# Patient Record
Sex: Female | Born: 1940 | Race: White | Hispanic: No | Marital: Married | Smoking: Never smoker
Health system: Southern US, Community
[De-identification: ages and names within clinical notes are randomized; demographics above are authoritative.]

## PROBLEM LIST (undated history)

## (undated) DIAGNOSIS — E7800 Pure hypercholesterolemia, unspecified: Secondary | ICD-10-CM

## (undated) DIAGNOSIS — F329 Major depressive disorder, single episode, unspecified: Secondary | ICD-10-CM

## (undated) DIAGNOSIS — I498 Other specified cardiac arrhythmias: Secondary | ICD-10-CM

## (undated) DIAGNOSIS — K219 Gastro-esophageal reflux disease without esophagitis: Secondary | ICD-10-CM

## (undated) DIAGNOSIS — I809 Phlebitis and thrombophlebitis of unspecified site: Secondary | ICD-10-CM

## (undated) DIAGNOSIS — I776 Arteritis, unspecified: Secondary | ICD-10-CM

## (undated) DIAGNOSIS — S0300XA Dislocation of jaw, unspecified side, initial encounter: Secondary | ICD-10-CM

## (undated) DIAGNOSIS — I10 Essential (primary) hypertension: Secondary | ICD-10-CM

## (undated) DIAGNOSIS — F32A Depression, unspecified: Secondary | ICD-10-CM

## (undated) DIAGNOSIS — E785 Hyperlipidemia, unspecified: Secondary | ICD-10-CM

## (undated) DIAGNOSIS — R42 Dizziness and giddiness: Secondary | ICD-10-CM

## (undated) DIAGNOSIS — M1990 Unspecified osteoarthritis, unspecified site: Secondary | ICD-10-CM

## (undated) DIAGNOSIS — I4891 Unspecified atrial fibrillation: Secondary | ICD-10-CM

## (undated) HISTORY — PX: OTHER SURGICAL HISTORY: SHX169

## (undated) HISTORY — PX: APPENDECTOMY: SHX54

## (undated) HISTORY — PX: CHOLECYSTECTOMY: SHX55

## (undated) HISTORY — PX: TONSILLECTOMY: SUR1361

## (undated) HISTORY — PX: SMALL BOWEL REPAIR: SHX6447

## (undated) HISTORY — PX: ABDOMINAL HYSTERECTOMY: SHX81

## (undated) HISTORY — PX: FOOT SURGERY: SHX648

---

## 2017-10-04 ENCOUNTER — Ambulatory Visit (INDEPENDENT_AMBULATORY_CARE_PROVIDER_SITE_OTHER): Payer: Medicare HMO

## 2017-10-04 ENCOUNTER — Ambulatory Visit
Admission: EM | Admit: 2017-10-04 | Discharge: 2017-10-04 | Disposition: A | Discharge status: Other Acute Inpatient Hospital | Payer: Medicare HMO | Attending: Family Medicine | Admitting: Family Medicine

## 2017-10-04 ENCOUNTER — Encounter: Payer: Self-pay | Admitting: Gynecology

## 2017-10-04 ENCOUNTER — Other Ambulatory Visit: Payer: Self-pay

## 2017-10-04 DIAGNOSIS — W19XXXA Unspecified fall, initial encounter: Secondary | ICD-10-CM

## 2017-10-04 DIAGNOSIS — R6884 Jaw pain: Secondary | ICD-10-CM | POA: Diagnosis not present

## 2017-10-04 DIAGNOSIS — R0789 Other chest pain: Secondary | ICD-10-CM

## 2017-10-04 DIAGNOSIS — S0080XA Unspecified superficial injury of other part of head, initial encounter: Secondary | ICD-10-CM

## 2017-10-04 DIAGNOSIS — S02612A Fracture of condylar process of left mandible, initial encounter for closed fracture: Secondary | ICD-10-CM

## 2017-10-04 HISTORY — DX: Dislocation of jaw, unspecified side, initial encounter: S03.00XA

## 2017-10-04 HISTORY — DX: Gastro-esophageal reflux disease without esophagitis: K21.9

## 2017-10-04 HISTORY — DX: Major depressive disorder, single episode, unspecified: F32.9

## 2017-10-04 HISTORY — DX: Dizziness and giddiness: R42

## 2017-10-04 HISTORY — DX: Essential (primary) hypertension: I10

## 2017-10-04 HISTORY — DX: Phlebitis and thrombophlebitis of unspecified site: I80.9

## 2017-10-04 HISTORY — DX: Unspecified atrial fibrillation: I48.91

## 2017-10-04 HISTORY — DX: Unspecified osteoarthritis, unspecified site: M19.90

## 2017-10-04 HISTORY — DX: Hyperlipidemia, unspecified: E78.5

## 2017-10-04 HISTORY — DX: Arteritis, unspecified: I77.6

## 2017-10-04 HISTORY — DX: Other specified cardiac arrhythmias: I49.8

## 2017-10-04 HISTORY — DX: Depression, unspecified: F32.A

## 2017-10-04 HISTORY — DX: Pure hypercholesterolemia, unspecified: E78.00

## 2017-10-04 NOTE — Discharge Instructions (Signed)
Go straight to the hospital. ? ?Take care ? ?Dr. Shaft Corigliano  ?

## 2017-10-04 NOTE — ED Provider Notes (Signed)
[facility]    CSN: [id number] Arrival date & time: 10/04/17  1600  History   Chief Complaint Chief Complaint  Patient presents with  . Fall   HPI  77 year old female presents following a fall.  Patient was in a parking lot and tripped and fell.  She hit her chin on concrete.  Patient has a small laceration.  Patient is reporting jaw pain.  Per review of the electron medical record, patient has chronic TMJ.  Patient reports that she is having right-sided chest pain, which is likely from the fall.  No medications or interventions tried.  Her pain is moderate to severe.  Jaw pain worse with opening the mouth.  Review of the EMR indicates that she has difficulty opening her mouth widely due to chronic TMJ.  She was brought in directly for evaluation given age and injury.  No reports of headache.  No loss of consciousness.  No nausea vomiting.  No other associated symptoms.  No other concerns at this time.  PMH: Chest discomfort  atypical - Normal SE 2008.  Hypertension    Hyperlipidemia    GERD (gastroesophageal reflux disease)    Atrial dysrhythmia  Likely atrial tachycardia versus atypical atrial flutter with rapid ventricular response.  Thrombophlebitis  Left forearm  History of echocardiogram  09/06/11 [facility]: Mild LVH with normal left ventricular systolic function. NL rt ventricular size & systolic function. No significant valvular regurgitation. No valvular stenosis. Mitral valve some buckling; no prolapse or MR. No pericardial effusion.   Vasculitis (CMS-HCC)    Hypercholesterolemia 05/31/2014 On lipitor (taken of another)   Peripheral arterial disease (CMS-HCC) 05/31/2014 Is s/p right ulnar bypass. (harvested vein in leg for this 2004)  Arthritis    Atrial fibrillation , unspecified (CMS-HCC)    TMJ pain dysfunction syndrome    Atrial tachycardia (CMS-HCC) 04/05/2015   Dizziness    Family history of coronary artery disease 01/04/2016 father and brother  -- pt taking lipitor, asp , zestril     Surgical Hx: right ulnar bypass   2004 - Peripheral arterial disease.   TONSILLECTOMY   1960   APPENDECTOMY   1965   cholelcystectomy   1970   small bowel obstruction repair   1973   history of hysterectomy   1965   left foot and ankle fracture repair   1997    OB History   None    Home Medications    Prior to Admission medications   Medication Sig Start Date End Date Taking? Authorizing Provider  aspirin EC 81 MG tablet Take by mouth.   Yes [provider]  atorvastatin (LIPITOR) 40 MG tablet Take 40 mg by mouth daily. 08/23/17  Yes [provider]  cyclobenzaprine (FLEXERIL) 5 MG tablet  07/23/17  Yes [provider]  meclizine (ANTIVERT) 25 MG tablet Take by mouth.   Yes [provider]  nitroGLYCERIN (NITRODUR - DOSED IN MG/24 HR) 0.2 mg/hr patch Apply to affected area every 24 hours, repeat as needed, do not use with medications such as cialis or viagra 01/02/16  Yes [provider]  pantoprazole (PROTONIX) 40 MG tablet Take 40 mg by mouth daily. 08/08/17  Yes [provider]  spironolactone (ALDACTONE) 25 MG tablet Take 25 mg by mouth daily. 08/23/17  Yes [provider]  valACYclovir (VALTREX) 500 MG tablet Take by mouth. 04/25/17  Yes [provider]  ALPRAZolam Prudy Feeler) 0.5 MG tablet Take 0.5 mg by mouth 2 (two) times daily. 09/04/17  [provider]    Family History Myocardial Infarction (Heart attack) Brother    Myocardial Infarction (Heart attack) Father    No Known Problems Maternal Aunt    No Known Problems Maternal Grandfather    No Known Problems Maternal Grandmother    No Known Problems Maternal Uncle    No Known Problems Mother    No Known Problems Paternal Aunt    No Known Problems Paternal Grandfather    No Known Problems Paternal Grandmother    No Known Problems Paternal Uncle    No Known Problems  Sister    Cancer     Kidney disease     Allergies Neg Hx    Anesthesia problems Neg Hx    Asthma Neg Hx    Clotting disorder Neg Hx    Diabetes Neg Hx    Hearing loss Neg Hx    Heart disease Neg Hx    Neurological disorder Neg Hx    Thyroid disease Neg Hx     Social History Social History   Tobacco Use  . Smoking status: Never Smoker  . Smokeless tobacco: Never Used  Substance Use Topics  . Alcohol use: Never    Frequency: Never  . Drug use: Never   Allergies   Alendronate; Celecoxib; Codeine; Risedronate; Simvastatin; Trazodone; and Sulfa antibiotics   Review of Systems Review of Systems  HENT:       Jaw pain.  Cardiovascular: Positive for chest pain.  Skin: Positive for wound.   Physical Exam Triage Vital Signs ED Triage Vitals  Enc Vitals Group     BP 10/04/17 1616 (!) 167/80     Pulse Rate 10/04/17 1616 85     Resp 10/04/17 1616 16     Temp 10/04/17 1616 98.4 F (36.9 C)     Temp Source 10/04/17 1616 Oral     SpO2 10/04/17 1616 100 %     Weight 10/04/17 1613 123 lb (55.8 kg)     Height 10/04/17 1613 5\' 3"  (1.6 m)     Head Circumference --      Peak Flow --      Pain Score 10/04/17 1613 10     Pain Loc --      Pain Edu? --      Excl. in GC? --    Updated Vital Signs BP (!) 167/80 (BP Location: Left Arm)   Pulse 85   Temp 98.4 F (36.9 C) (Oral)   Resp 16   Ht 5\' 3"  (1.6 m)   Wt 123 lb (55.8 kg)   SpO2 100%   BMI 21.79 kg/m   Visual Acuity Right Eye Distance:   Left Eye Distance:   Bilateral Distance:    Right Eye Near:   Left Eye Near:    Bilateral Near:     Physical Exam  Constitutional: She is oriented to person, place, and time.  Thin elderly female.  Appears uncomfortable.  No apparent distress.  HENT:  Patient with tenderness of the left jaw particular around the TMJ and just distal.  Pain with opening the mouth.  Cardiovascular: Normal rate and regular rhythm.  Pulmonary/Chest: Effort normal and breath  sounds normal. She has no wheezes. She has no rales.  Neurological: She is alert and oriented to person, place, and time.  Skin:  Small chin laceration noted.  Psychiatric: She has a normal mood and affect. Her behavior is normal.  Nursing note and vitals reviewed.  UC Treatments / Results  Labs (  all labs ordered are listed, but only abnormal results are displayed) Labs Reviewed - No data to display  EKG None  Radiology Dg Mandible 4 Views  Result Date: 10/04/2017 CLINICAL DATA:  Chin laceration and pain in the left TMJ region following a fall today. EXAM: MANDIBLE - 4+ VIEW COMPARISON:  None. FINDINGS: On the frontal view, there is evidence of a displaced left mandibular condylar neck fracture. This is not confirmed on the other views, due to bony overlap. IMPRESSION: Suboptimal visualization of the mandible with a probable displaced left mandibular condylar neck fracture. Further evaluation with a maxillofacial CT without contrast is recommended. Electronically Signed   By: Beckie Salts M.D.   On: 10/04/2017 16:43    Procedures Procedures (including critical care time)  Medications Ordered in UC Medications - No data to display  Initial Impression / Assessment and Plan / UC Course  I have reviewed the triage vital signs and the nursing notes.  Pertinent labs & imaging results that were available during my care of the patient were reviewed by me and considered in my medical decision making (see chart for details).    77 year old female presents for evaluation after suffering a fall.  Patient has an open wound and x-ray revealed a mandible fracture.  Daughter is taking her directly to the hospital for further evaluation and treatment.  Final Clinical Impressions(s) / UC Diagnoses   Final diagnoses:  Fall  Closed fracture of left condylar process of mandible, initial encounter [facility])     Discharge Instructions     Go straight to the hospital.  Take care  Dr. Adriana Simas      ED Prescriptions    None     Controlled Substance Prescriptions [place] Controlled Substance Registry consulted? Not Applicable   Tommie Sams, DO 10/04/17 1656

## 2017-10-04 NOTE — ED Triage Notes (Signed)
Per patient fell at parking lot today. Patient stated felt pop at left side face. Patient present today with laceration to her chin. Patient also stated experiencing pain in her chest.

## 2017-10-05 MED ORDER — ONDANSETRON HCL 4 MG/2ML IJ SOLN
4.00 | INTRAMUSCULAR | Status: DC
Start: ? — End: 2017-10-05

## 2017-10-05 MED ORDER — GENERIC EXTERNAL MEDICATION
1.00 | Status: DC
Start: 2017-10-05 — End: 2017-10-05

## 2017-10-05 MED ORDER — ATORVASTATIN CALCIUM 40 MG PO TABS
40.00 | ORAL_TABLET | ORAL | Status: DC
Start: 2017-10-06 — End: 2017-10-05

## 2017-10-05 MED ORDER — MORPHINE SULFATE 2 MG/ML IJ SOLN
2.00 | INTRAMUSCULAR | Status: DC
Start: ? — End: 2017-10-05

## 2017-10-05 MED ORDER — CYCLOBENZAPRINE HCL 10 MG PO TABS
5.00 | ORAL_TABLET | ORAL | Status: DC
Start: 2017-10-06 — End: 2017-10-05

## 2017-10-05 MED ORDER — LIDOCAINE HCL 1 % IJ SOLN
0.50 | INTRAMUSCULAR | Status: DC
Start: ? — End: 2017-10-05

## 2017-10-05 MED ORDER — LIDOCAINE 5 % EX PTCH
1.00 | MEDICATED_PATCH | CUTANEOUS | Status: DC
Start: 2017-10-06 — End: 2017-10-05

## 2017-10-05 MED ORDER — ALPRAZOLAM 0.5 MG PO TABS
0.50 | ORAL_TABLET | ORAL | Status: DC
Start: 2017-10-05 — End: 2017-10-05

## 2021-04-26 ENCOUNTER — Emergency Department
Admission: EM | Admit: 2021-04-26 | Discharge: 2021-04-26 | Disposition: A | Discharge status: Home / Self Care | Payer: Medicare HMO | Attending: Emergency Medicine | Admitting: Emergency Medicine

## 2021-04-26 ENCOUNTER — Other Ambulatory Visit: Payer: Self-pay

## 2021-04-26 ENCOUNTER — Encounter: Payer: Self-pay | Admitting: Emergency Medicine

## 2021-04-26 DIAGNOSIS — M542 Cervicalgia: Secondary | ICD-10-CM | POA: Diagnosis present

## 2021-04-26 DIAGNOSIS — M436 Torticollis: Secondary | ICD-10-CM | POA: Insufficient documentation

## 2021-04-26 DIAGNOSIS — I10 Essential (primary) hypertension: Secondary | ICD-10-CM | POA: Insufficient documentation

## 2021-04-26 MED ORDER — DIAZEPAM 2 MG PO TABS
ORAL_TABLET | ORAL | Status: AC
  Filled 2021-04-26: qty 1

## 2021-04-26 MED ORDER — ONDANSETRON 4 MG PO TBDP
ORAL_TABLET | ORAL | Status: AC
  Filled 2021-04-26: qty 1

## 2021-04-26 MED ORDER — TRAMADOL HCL 50 MG PO TABS
ORAL_TABLET | ORAL | Status: AC
  Filled 2021-04-26: qty 1

## 2021-04-26 MED ORDER — KETOROLAC TROMETHAMINE 30 MG/ML IJ SOLN
INTRAMUSCULAR | Status: AC
  Filled 2021-04-26: qty 1

## 2021-04-26 NOTE — ED Provider Notes (Signed)
Patient was seen during Epic computer downtime; please refer to paper chart for details.   Irean Hong, MD 04/26/21 [phone]

## 2021-04-26 NOTE — ED Triage Notes (Signed)
Patient ambulatory to triage with steady gait, without difficulty or distress noted; pt reports neck pain with ROM since awakening yesterday am; seen at [facility] and had inj for "spasms" but pain returned; pt denies any injury

## 2021-04-26 NOTE — ED Triage Notes (Addendum)
Pt arrives via POV with CC of neck pain that began on 04/25/21 upon awakening. Pt reports sleeping on L side without a pillow that night. Pt states pain began on R side of neck and is now on the left and back of neck and in the shoulders. Pt rates pain 10/10. Denies dizziness, lightheadedness, and changes in vision.

## 2021-08-12 ENCOUNTER — Emergency Department: Payer: Medicare HMO

## 2021-08-12 ENCOUNTER — Observation Stay
Admission: EM | Admit: 2021-08-12 | Discharge: 2021-08-15 | Disposition: A | Discharge status: Skilled Nursing Facility | Payer: Medicare HMO | Attending: Internal Medicine | Admitting: Internal Medicine

## 2021-08-12 ENCOUNTER — Other Ambulatory Visit: Payer: Self-pay

## 2021-08-12 DIAGNOSIS — M47812 Spondylosis without myelopathy or radiculopathy, cervical region: Secondary | ICD-10-CM | POA: Insufficient documentation

## 2021-08-12 DIAGNOSIS — Z7982 Long term (current) use of aspirin: Secondary | ICD-10-CM | POA: Diagnosis not present

## 2021-08-12 DIAGNOSIS — M25551 Pain in right hip: Secondary | ICD-10-CM | POA: Diagnosis present

## 2021-08-12 DIAGNOSIS — W010XXA Fall on same level from slipping, tripping and stumbling without subsequent striking against object, initial encounter: Secondary | ICD-10-CM | POA: Insufficient documentation

## 2021-08-12 DIAGNOSIS — E871 Hypo-osmolality and hyponatremia: Secondary | ICD-10-CM

## 2021-08-12 DIAGNOSIS — S32591A Other specified fracture of right pubis, initial encounter for closed fracture: Principal | ICD-10-CM

## 2021-08-12 DIAGNOSIS — Z8679 Personal history of other diseases of the circulatory system: Secondary | ICD-10-CM

## 2021-08-12 DIAGNOSIS — S329XXA Fracture of unspecified parts of lumbosacral spine and pelvis, initial encounter for closed fracture: Secondary | ICD-10-CM | POA: Diagnosis not present

## 2021-08-12 DIAGNOSIS — Y9222 Religious institution as the place of occurrence of the external cause: Secondary | ICD-10-CM | POA: Insufficient documentation

## 2021-08-12 DIAGNOSIS — S32501A Unspecified fracture of right pubis, initial encounter for closed fracture: Secondary | ICD-10-CM | POA: Diagnosis not present

## 2021-08-12 DIAGNOSIS — I4891 Unspecified atrial fibrillation: Secondary | ICD-10-CM | POA: Diagnosis not present

## 2021-08-12 DIAGNOSIS — Z79899 Other long term (current) drug therapy: Secondary | ICD-10-CM | POA: Insufficient documentation

## 2021-08-12 DIAGNOSIS — I6782 Cerebral ischemia: Secondary | ICD-10-CM | POA: Insufficient documentation

## 2021-08-12 DIAGNOSIS — I10 Essential (primary) hypertension: Secondary | ICD-10-CM | POA: Diagnosis present

## 2021-08-12 DIAGNOSIS — E785 Hyperlipidemia, unspecified: Secondary | ICD-10-CM | POA: Diagnosis present

## 2021-08-12 DIAGNOSIS — K219 Gastro-esophageal reflux disease without esophagitis: Secondary | ICD-10-CM

## 2021-08-12 LAB — COMPREHENSIVE METABOLIC PANEL
ALT: 17 U/L (ref 0–44)
AST: 25 U/L (ref 15–41)
Albumin: 3.6 g/dL (ref 3.5–5.0)
Alkaline Phosphatase: 48 U/L (ref 38–126)
Anion gap: 9 (ref 5–15)
BUN: 17 mg/dL (ref 8–23)
CO2: 27 mmol/L (ref 22–32)
Calcium: 9 mg/dL (ref 8.9–10.3)
Chloride: 105 mmol/L (ref 98–111)
Creatinine, Ser: 0.74 mg/dL (ref 0.44–1.00)
GFR, Estimated: >60 mL/min (ref >60)
Glucose, Bld: 86 mg/dL (ref 70–99)
Potassium: 3.6 mmol/L (ref 3.5–5.1)
Sodium: 141 mmol/L (ref 135–145)
Total Bilirubin: 1 mg/dL (ref 0.3–1.2)
Total Protein: 6.1 g/dL — ABNORMAL LOW (ref 6.5–8.1)

## 2021-08-12 LAB — CBC WITH DIFFERENTIAL/PLATELET
Abs Immature Granulocytes: 0.06 10*3/uL (ref 0.00–0.07)
Basophils Absolute: 0 10*3/uL (ref 0.0–0.1)
Basophils Relative: 0 %
Eosinophils Absolute: 0.1 10*3/uL (ref 0.0–0.5)
Eosinophils Relative: 1 %
HCT: 39.4 % (ref 36.0–46.0)
Hemoglobin: 13.1 g/dL (ref 12.0–15.0)
Immature Granulocytes: 1 %
Lymphocytes Relative: 11 %
Lymphs Abs: 1.1 10*3/uL (ref 0.7–4.0)
MCH: 32 pg (ref 26.0–34.0)
MCHC: 33.2 g/dL (ref 30.0–36.0)
MCV: 96.1 fL (ref 80.0–100.0)
Monocytes Absolute: 0.7 10*3/uL (ref 0.1–1.0)
Monocytes Relative: 7 %
Neutro Abs: 7.8 10*3/uL — ABNORMAL HIGH (ref 1.7–7.7)
Neutrophils Relative %: 80 %
Platelets: 190 10*3/uL (ref 150–400)
RBC: 4.1 MIL/uL (ref 3.87–5.11)
RDW: 11.7 % (ref 11.5–15.5)
WBC: 9.8 10*3/uL (ref 4.0–10.5)
nRBC: 0 % (ref 0.0–0.2)

## 2021-08-12 MED ORDER — PANTOPRAZOLE SODIUM 40 MG PO TBEC
40.0000 mg | DELAYED_RELEASE_TABLET | Freq: Every day | ORAL | Status: DC
Start: 2021-08-12 — End: 2021-08-15
  Administered 2021-08-13 – 2021-08-15 (×3): 40 mg via ORAL
  Filled 2021-08-12 (×4): qty 1

## 2021-08-12 MED ORDER — PROMETHAZINE HCL 25 MG PO TABS: 25.0000 mg | ORAL_TABLET | ORAL | Status: DC | PRN

## 2021-08-12 MED ORDER — ALPRAZOLAM 0.5 MG PO TABS: 0.5000 mg | ORAL_TABLET | Freq: Two times a day (BID) | ORAL | Status: DC

## 2021-08-12 MED ORDER — SENNOSIDES-DOCUSATE SODIUM 8.6-50 MG PO TABS
1.0000 | ORAL_TABLET | Freq: Every evening | ORAL | Status: DC | PRN
  Administered 2021-08-13 – 2021-08-15 (×2): 1 via ORAL
  Filled 2021-08-12 (×2): qty 1

## 2021-08-12 MED ORDER — HYDROMORPHONE HCL 1 MG/ML IJ SOLN
0.5000 mg | INTRAMUSCULAR | Status: DC | PRN
  Administered 2021-08-12: 0.5 mg via INTRAVENOUS
  Filled 2021-08-12: qty 1

## 2021-08-12 MED ORDER — ASPIRIN 81 MG PO TBEC
81.0000 mg | DELAYED_RELEASE_TABLET | Freq: Every day | ORAL | Status: DC
  Administered 2021-08-12 – 2021-08-15 (×4): 81 mg via ORAL
  Filled 2021-08-12 (×4): qty 1

## 2021-08-12 MED ORDER — IPRATROPIUM BROMIDE 0.03 % NA SOLN: 2.0000 | Freq: Two times a day (BID) | NASAL | Status: DC

## 2021-08-12 MED ORDER — ATORVASTATIN CALCIUM 20 MG PO TABS
40.0000 mg | ORAL_TABLET | Freq: Every day | ORAL | Status: DC
Start: 2021-08-12 — End: 2021-08-15
  Administered 2021-08-12 – 2021-08-15 (×4): 40 mg via ORAL
  Filled 2021-08-12 (×4): qty 2

## 2021-08-12 MED ORDER — HYDROCODONE-ACETAMINOPHEN 5-325 MG PO TABS
1.0000 | ORAL_TABLET | Freq: Four times a day (QID) | ORAL | Status: DC | PRN
  Administered 2021-08-12 – 2021-08-15 (×8): 1 via ORAL
  Administered 2021-08-15: 2 via ORAL
  Filled 2021-08-12 (×2): qty 1
  Filled 2021-08-12: qty 2
  Filled 2021-08-12 (×6): qty 1

## 2021-08-12 MED ORDER — HYDROMORPHONE HCL 1 MG/ML IJ SOLN
0.5000 mg | Freq: Once | INTRAMUSCULAR | Status: AC
  Administered 2021-08-12: 0.5 mg via INTRAVENOUS
  Filled 2021-08-12: qty 0.5

## 2021-08-12 MED ORDER — ALPRAZOLAM 0.5 MG PO TABS
0.5000 mg | ORAL_TABLET | Freq: Two times a day (BID) | ORAL | Status: DC | PRN
  Administered 2021-08-12 – 2021-08-14 (×3): 0.5 mg via ORAL
  Filled 2021-08-12 (×3): qty 1

## 2021-08-12 MED ORDER — ENOXAPARIN SODIUM 40 MG/0.4ML IJ SOSY
40.0000 mg | PREFILLED_SYRINGE | INTRAMUSCULAR | Status: DC
  Administered 2021-08-12 – 2021-08-14 (×3): 40 mg via SUBCUTANEOUS
  Filled 2021-08-12 (×3): qty 0.4

## 2021-08-12 MED ORDER — DOCUSATE SODIUM 100 MG PO CAPS
100.0000 mg | ORAL_CAPSULE | Freq: Two times a day (BID) | ORAL | Status: DC
  Administered 2021-08-12 – 2021-08-15 (×7): 100 mg via ORAL
  Filled 2021-08-12 (×7): qty 1

## 2021-08-12 MED ORDER — MELOXICAM 7.5 MG PO TABS
15.0000 mg | ORAL_TABLET | Freq: Every day | ORAL | Status: DC
Start: 2021-08-12 — End: 2021-08-15
  Administered 2021-08-13 – 2021-08-15 (×3): 15 mg via ORAL
  Filled 2021-08-12 (×3): qty 2

## 2021-08-12 MED ORDER — ONDANSETRON HCL 4 MG/2ML IJ SOLN
4.0000 mg | Freq: Once | INTRAMUSCULAR | Status: AC
  Administered 2021-08-12: 4 mg via INTRAVENOUS
  Filled 2021-08-12: qty 2

## 2021-08-12 MED ORDER — SPIRONOLACTONE 25 MG PO TABS
25.0000 mg | ORAL_TABLET | Freq: Every day | ORAL | Status: DC
Start: 2021-08-12 — End: 2021-08-15
  Administered 2021-08-13 – 2021-08-15 (×3): 25 mg via ORAL
  Filled 2021-08-12 (×3): qty 1

## 2021-08-12 MED ORDER — FENTANYL CITRATE PF 50 MCG/ML IJ SOSY
50.0000 ug | PREFILLED_SYRINGE | INTRAMUSCULAR | Status: DC | PRN
  Administered 2021-08-12: 50 ug via INTRAVENOUS
  Filled 2021-08-12: qty 1

## 2021-08-12 NOTE — Progress Notes (Signed)
Called by ED physician, Dr. Starleen Blue. Imaging reviewed. There appears to be right and left pubic rami fractures. No evidence of proximal femur or acetabular fractures.  WBAT on bilateral lower extremities PT/OT for mobilization after appropriate pain control Follow up with Orthopedics in ~2 weeks for re-evaluation. Please page with any questions.
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
## 2021-08-12 NOTE — ED Notes (Signed)
Lab at bedside to obtain bloodwork

## 2021-08-12 NOTE — H&P (Addendum)
History and Physical    Misty SaleBarbara Thiemann [id number] DOB: 01-25-1941 DOA: 08/12/2021  PCP: Marlan PalauGard, Allison  Patient coming from: church s/p fall  I have personally briefly reviewed patient's [facility]  Chief Complaint: fall with hip pain   HPI: Misty Marshall is a 81 y.o. female with medical history significant of Afib, HTN, HLD,GERD, Depression,PAD Who presents to ed s/p mechanical fall at church after she broke a church member fall, s/p which patient  fell.  After patient fall she noted hip pain and was unable to ambulate. She notes currently no HA, n/v/d/dysuria/chest pain /sob fever or chills.   ED Course:  Afeb, bp 162/89, hr 68, sat 95% on ra    EKG: Sinus rhythm with PAC LAFB  Imaging:  Xray hip IMPRESSION: Essentially nondisplaced acute fractures of the right superior and inferior pubic rami. Cxr: Hyperexpansion with chronic interstitial coarsening compatible with emphysema.   Streaky opacity in the infrahilar right base likely atelectatic or scarring.    CT Pelvis   IMPRESSION: Minimally displaced right inferior pubic ramus and nondisplaced right superior pubic ramus, right pubic body, and left pubic body acute fractures.   Tx Zofran, fentanyl,dilaudid injection 0.5mg  x1  Review of Systems: As per HPI otherwise 10 point review of systems negative.   Past Medical History:  Diagnosis Date   Arthritis    Atrial dysrhythmia    Atrial fibrillation (HCC)    Depression    Dizziness    GERD (gastroesophageal reflux disease)    Hypercholesteremia    Hyperlipemia    Hypertension    Thrombophlebitis    TMJ (dislocation of temporomandibular joint)    Vasculitis (HCC)     Past Surgical History:  Procedure Laterality Date   ABDOMINAL HYSTERECTOMY     APPENDECTOMY     CHOLECYSTECTOMY     FOOT SURGERY     SMALL BOWEL REPAIR     TONSILLECTOMY     ulnar bypass Right      reports that she has never smoked. She has never  used smokeless tobacco. She reports that she does not drink alcohol and does not use drugs.  Allergies  Allergen Reactions   Alendronate Other (See Comments)    Difficulty swallowing, stomach pain   Celecoxib Other (See Comments)    Stomach pain   Codeine Other (See Comments)    Other Reaction: Not Assessed   Risedronate Other (See Comments)    diffculty swallowing, stomach pain   Simvastatin Other (See Comments)   Trazodone Other (See Comments)   Sulfa Antibiotics Hives and Rash    Family History  Problem Relation Age of Onset   Heart failure Mother    Heart failure Brother     Prior to Admission medications   Medication Sig Start Date End Date Taking? Authorizing Provider  ALPRAZolam Prudy Feeler(XANAX) 0.5 MG tablet Take 0.5 mg by mouth 2 (two) times daily. 09/04/17   [provider]  aspirin EC 81 MG tablet Take by mouth.    [provider]  atorvastatin (LIPITOR) 40 MG tablet Take 40 mg by mouth daily. 08/23/17   [provider]  cyclobenzaprine (FLEXERIL) 5 MG tablet  07/23/17   [provider]  ipratropium (ATROVENT) 0.03 % nasal spray Place 2 sprays into both nostrils 2 (two) times daily. 03/07/21   [provider]  meclizine (ANTIVERT) 25 MG tablet Take by mouth.    [provider]  nitroGLYCERIN (NITRODUR - DOSED IN MG/24 HR) 0.2 mg/hr  patch Apply to affected area every 24 hours, repeat as needed, do not use with medications such as cialis or viagra 01/02/16   [provider]  pantoprazole (PROTONIX) 40 MG tablet Take 40 mg by mouth daily. 08/08/17   [provider]  spironolactone (ALDACTONE) 25 MG tablet Take 25 mg by mouth daily. 08/23/17   [provider]  valACYclovir (VALTREX) 500 MG tablet Take by mouth. 04/25/17   [provider]    Physical Exam: Vitals:   08/12/21 1158 08/12/21 1159 08/12/21 1200  BP: (!) 162/89  (!) 162/89  Pulse: 68  74  Resp: 18    Temp: 97.9 F (36.6 C)    SpO2:  99%  95%  Weight:  58.1 kg   Height:  5\' 3"  (1.6 m)      Vitals:   08/12/21 1158 08/12/21 1159 08/12/21 1200  BP: (!) 162/89  (!) 162/89  Pulse: 68  74  Resp: 18    Temp: 97.9 F (36.6 C)    SpO2: 99%  95%  Weight:  58.1 kg   Height:  5\' 3"  (1.6 m)   Constitutional: NAD, calm, comfortable Eyes: PERRL, lids and conjunctivae normal ENMT: Mucous membranes are moist. Posterior pharynx clear of any exudate or lesions.Normal dentition.  Neck: normal, supple, no masses, no thyromegaly Respiratory: clear to auscultation bilaterally, no wheezing, no crackles. Normal respiratory effort. No accessory muscle use.  Cardiovascular: Regular rate and rhythm, no murmurs / rubs / gallops. No extremity edema. 2+ pedal pulses.   Abdomen: no tenderness, no masses palpated. No hepatosplenomegaly. Bowel sounds positive.  Musculoskeletal: no clubbing / cyanosis. No joint deformity upper and lower extremities. Pain on rom, tenderness pelvic rim ,no contractures. Normal muscle tone.  Skin: no rashes, lesions, ulcers. No induration Neurologic: CN 2-12 grossly intact. Sensation intact, DTR normal. Strength in lower ext not tested due to fx /pain Psychiatric: Normal judgment and insight. Alert and oriented x 3. Normal mood.    Labs on Admission: I have personally reviewed following labs and imaging studies  CBC: Recent Labs  Lab 08/12/21 1401  WBC 9.8  NEUTROABS 7.8*  HGB 13.1  HCT 39.4  MCV 96.1  PLT 190   Basic Metabolic Panel: Recent Labs  Lab 08/12/21 1401  NA 141  K 3.6  CL 105  CO2 27  GLUCOSE 86  BUN 17  CREATININE 0.74  CALCIUM 9.0   GFR: Estimated Creatinine Clearance: 44.8 mL/min (by C-G formula based on SCr of 0.74 mg/dL). Liver Function Tests: Recent Labs  Lab 08/12/21 1401  AST 25  ALT 17  ALKPHOS 48  BILITOT 1.0  PROT 6.1*  ALBUMIN 3.6   No results for input(s): LIPASE, AMYLASE in the last 168 hours. No results for input(s): AMMONIA in the last 168  hours. Coagulation Profile: No results for input(s): INR, PROTIME in the last 168 hours. Cardiac Enzymes: No results for input(s): CKTOTAL, CKMB, CKMBINDEX, TROPONINI in the last 168 hours. BNP (last 3 results) No results for input(s): PROBNP in the last 8760 hours. HbA1C: No results for input(s): HGBA1C in the last 72 hours. CBG: No results for input(s): GLUCAP in the last 168 hours. Lipid Profile: No results for input(s): CHOL, HDL, LDLCALC, TRIG, CHOLHDL, LDLDIRECT in the last 72 hours. Thyroid Function Tests: No results for input(s): TSH, T4TOTAL, FREET4, T3FREE, THYROIDAB in the last 72 hours. Anemia Panel: No results for input(s): VITAMINB12, FOLATE, FERRITIN, TIBC, IRON, RETICCTPCT in the last 72 hours. Urine analysis: No results  found for: COLORURINE, APPEARANCEUR, LABSPEC, PHURINE, GLUCOSEU, HGBUR, BILIRUBINUR, KETONESUR, PROTEINUR, UROBILINOGEN, NITRITE, LEUKOCYTESUR  Radiological Exams on Admission: CT HEAD WO CONTRAST ( )  Result Date: 08/12/2021 CLINICAL DATA:  Neck trauma.  Status post fall. EXAM: CT HEAD WITHOUT CONTRAST CT CERVICAL SPINE WITHOUT CONTRAST TECHNIQUE: Multidetector CT imaging of the head and cervical spine was performed following the standard protocol without intravenous contrast. Multiplanar CT image reconstructions of the cervical spine were also generated. RADIATION DOSE REDUCTION: This exam was performed according to the departmental dose-optimization program which includes automated exposure control, adjustment of the mA and/or kV according to patient size and/or use of iterative reconstruction technique. COMPARISON:  None Available. FINDINGS: CT HEAD FINDINGS Brain: No evidence of acute infarction, hemorrhage, hydrocephalus, extra-axial collection or mass lesion/mass effect. There is mild diffuse low-attenuation within the subcortical and periventricular white matter compatible with chronic microvascular disease. Vascular: No hyperdense vessel or  unexpected calcification. Skull: No acute abnormality. Remote healed deformity involving the left mandibular condyle. Sinuses/Orbits: Paranasal sinuses and mastoid air cells are clear. Other: None CT CERVICAL SPINE FINDINGS Alignment: No signs of acute posttraumatic mild alignment of the cervical spine. Chronic anterolisthesis C4 on C5 is identified measuring 2 mm, image 42/5. Skull base and vertebrae: No acute fracture. No primary bone lesion or focal pathologic process. Soft tissues and spinal canal: No prevertebral fluid or swelling. No visible canal hematoma. Disc levels: Mild multilevel disc space narrowing is identified involving C3-4, C4-5 and C5-6 Upper chest: Negative. Other: None IMPRESSION: 1. No acute intracranial abnormality. 2. 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 3. No evidence for acute cervical spine fracture or subluxation. 4. Cervical spondylosis. Electronically Signed   By: Signa Kell M.D.   On: 08/12/2021 13:33   CT Cervical Spine Wo Contrast  Result Date: 08/12/2021 CLINICAL DATA:  Neck trauma.  Status post fall. EXAM: CT HEAD WITHOUT CONTRAST CT CERVICAL SPINE WITHOUT CONTRAST TECHNIQUE: Multidetector CT imaging of the head and cervical spine was performed following the standard protocol without intravenous contrast. Multiplanar CT image reconstructions of the cervical spine were also generated. RADIATION DOSE REDUCTION: This exam was performed according to the departmental dose-optimization program which includes automated exposure control, adjustment of the mA and/or kV according to patient size and/or use of iterative reconstruction technique. COMPARISON:  None Available. FINDINGS: CT HEAD FINDINGS Brain: No evidence of acute infarction, hemorrhage, hydrocephalus, extra-axial collection or mass lesion/mass effect. There is mild diffuse low-attenuation within the subcortical and periventricular white matter compatible with chronic microvascular disease. Vascular: No  hyperdense vessel or unexpected calcification. Skull: No acute abnormality. Remote healed deformity involving the left mandibular condyle. Sinuses/Orbits: Paranasal sinuses and mastoid air cells are clear. Other: None CT CERVICAL SPINE FINDINGS Alignment: No signs of acute posttraumatic mild alignment of the cervical spine. Chronic anterolisthesis C4 on C5 is identified measuring 2 mm, image 42/5. Skull base and vertebrae: No acute fracture. No primary bone lesion or focal pathologic process. Soft tissues and spinal canal: No prevertebral fluid or swelling. No visible canal hematoma. Disc levels: Mild multilevel disc space narrowing is identified involving C3-4, C4-5 and C5-6 Upper chest: Negative. Other: None IMPRESSION: 1. No acute intracranial abnormality. 2. 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 3. No evidence for acute cervical spine fracture or subluxation. 4. Cervical spondylosis. Electronically Signed   By: Signa Kell M.D.   On: 08/12/2021 13:33   CT PELVIS WO CONTRAST  Result Date: 08/12/2021 CLINICAL DATA:  Hip trauma.  Fracture suspected. EXAM: CT PELVIS  WITHOUT CONTRAST TECHNIQUE: Multidetector CT imaging of the pelvis was performed following the standard protocol without intravenous contrast. RADIATION DOSE REDUCTION: This exam was performed according to the departmental dose-optimization program which includes automated exposure control, adjustment of the mA and/or kV according to patient size and/or use of iterative reconstruction technique. COMPARISON:  Pelvis and right hip radiographs 08/12/2021; CT abdomen and pelvis 09/07/2019 FINDINGS: Urinary Tract:  No focal urinary bladder wall thickening. Bowel: Moderate to high-grade sigmoid diverticulosis. No inflammatory changes to indicate acute diverticulitis. Vascular/Lymphatic: Minimally partially visualized atherosclerotic calcifications at the aortic-iliac bifurcation. Reproductive: The uterus appears to be surgically absent. No  gross adnexal abnormality is seen. Other: Moderate left-greater-than-right chronic enthesopathic changes at the bilateral common hamstring tendon origins off the ischial tuberosities. Musculoskeletal: There is a nondisplaced linear vertical oriented acute fracture within the superolateral aspect of the right pubic ramus (coronal series 9, image 40 and axial series 4 images 30 and 31). There is an oblique, acute fracture of the right inferior pubic ramus with mild 3 mm lateral displacement of the anterior fracture component respect of the posterior fracture component (axial series 4, image 41 and coronal series 9, image 58). There is an acute, nondisplaced vertically oriented fracture within the left pubic body (axial series 4, image 34, sagittal series 10, image 126, coronal series 9, image 31). Minimal 1 mm cortical step-off of the superior aspect of the right pubic body, likely an additional tiny fracture (coronal series 9, image 34). Small sclerotic bone island within the superomedial left acetabulum, similar to 09/07/2019 CT. Moderate to severe bilateral femoroacetabular joint space narrowing. The bilateral sacroiliac joint spaces are maintained. IMPRESSION: Minimally displaced right inferior pubic ramus and nondisplaced right superior pubic ramus, right pubic body, and left pubic body acute fractures. Electronically Signed   By: Neita Garnet M.D.   On: 08/12/2021 14:42   DG Chest Portable 1 View  Result Date: 08/12/2021 CLINICAL DATA:  Status post fall. EXAM: PORTABLE CHEST 1 VIEW COMPARISON:  None Available. FINDINGS: The lungs are clear without focal pneumonia, edema, pneumothorax or pleural effusion. Streaky opacity in the medial right base is probably related to atelectasis or scarring the cardiopericardial silhouette is within normal limits for size. Interstitial markings are diffusely coarsened with chronic features. Bones are diffusely demineralized. IMPRESSION: Hyperexpansion with chronic  interstitial coarsening compatible with emphysema. Streaky opacity in the infrahilar right base likely atelectatic or scarring. Electronically Signed   By: Kennith Center M.D.   On: 08/12/2021 13:06   DG Hip Unilat  With Pelvis 2-3 Views Right  Result Date: 08/12/2021 CLINICAL DATA:  Right hip pain following a fall today. EXAM: DG HIP (WITH OR WITHOUT PELVIS) 2-3V RIGHT COMPARISON:  None Available. FINDINGS: Normal appearing right hip without fracture or dislocation. There are essentially nondisplaced acute fractures of the right superior and inferior pubic rami. No other fractures and no dislocations. IMPRESSION: Essentially nondisplaced acute fractures of the right superior and inferior pubic rami. Electronically Signed   By: Beckie Salts M.D.   On: 08/12/2021 12:43    EKG: Independently reviewed. See above  Assessment/Plan  Acute pelvic fracture s/p mechanical fall  -weight bearing as tolerated  -PT/OT -supportive pain medication   Afib -remote history per patient  -on asa solely  -ekg currently sinus    HTN -resume home regimen -currently stable    HLD -continue statin   GERD -ppi     Depression -resume home regimen   Vasculitis -no active issues   DVT prophylaxis:  lwmh Code Status: full Family Communication: n/a Disposition Plan: patient  expected to be admitted greater than 2 midnights  Consults called: orthopedics  Admission status:    Lurline Del MD [facility]   If 7PM-7AM, please contact night-coverage [email or web address] Password [facility]  08/12/2021, 3:02 PM

## 2021-08-12 NOTE — ED Notes (Signed)
Pt transported to XR at this time.   Family at bedside

## 2021-08-13 DIAGNOSIS — K219 Gastro-esophageal reflux disease without esophagitis: Secondary | ICD-10-CM

## 2021-08-13 DIAGNOSIS — Z8679 Personal history of other diseases of the circulatory system: Secondary | ICD-10-CM

## 2021-08-13 DIAGNOSIS — E785 Hyperlipidemia, unspecified: Secondary | ICD-10-CM | POA: Diagnosis present

## 2021-08-13 DIAGNOSIS — I10 Essential (primary) hypertension: Secondary | ICD-10-CM | POA: Diagnosis present

## 2021-08-13 DIAGNOSIS — E871 Hypo-osmolality and hyponatremia: Secondary | ICD-10-CM

## 2021-08-13 LAB — URINALYSIS, ROUTINE W REFLEX MICROSCOPIC
Bacteria, UA: NONE SEEN
Bilirubin Urine: NEGATIVE
Glucose, UA: NEGATIVE mg/dL
Hgb urine dipstick: NEGATIVE
Ketones, ur: 5 mg/dL — AB
Nitrite: NEGATIVE
Protein, ur: NEGATIVE mg/dL
Specific Gravity, Urine: 1.023 (ref 1.005–1.030)
pH: 5 (ref 5.0–8.0)

## 2021-08-13 LAB — BASIC METABOLIC PANEL WITH GFR
Anion gap: 6 (ref 5–15)
BUN: 21 mg/dL (ref 8–23)
CO2: 28 mmol/L (ref 22–32)
Calcium: 8.6 mg/dL — ABNORMAL LOW (ref 8.9–10.3)
Chloride: 99 mmol/L (ref 98–111)
Creatinine, Ser: 0.92 mg/dL (ref 0.44–1.00)
GFR, Estimated: >60 mL/min
Glucose, Bld: 101 mg/dL — ABNORMAL HIGH (ref 70–99)
Potassium: 3.8 mmol/L (ref 3.5–5.1)
Sodium: 133 mmol/L — ABNORMAL LOW (ref 135–145)

## 2021-08-13 LAB — CBC
HCT: 38 % (ref 36.0–46.0)
Hemoglobin: 12.7 g/dL (ref 12.0–15.0)
MCH: 32.1 pg (ref 26.0–34.0)
MCHC: 33.4 g/dL (ref 30.0–36.0)
MCV: 96 fL (ref 80.0–100.0)
Platelets: 180 10*3/uL (ref 150–400)
RBC: 3.96 MIL/uL (ref 3.87–5.11)
RDW: 11.7 % (ref 11.5–15.5)
WBC: 9.3 10*3/uL (ref 4.0–10.5)
nRBC: 0 % (ref 0.0–0.2)

## 2021-08-13 MED ORDER — SODIUM CHLORIDE 0.9 % IV SOLN: 6.2500 mg | Freq: Four times a day (QID) | INTRAVENOUS | Status: DC | PRN

## 2021-08-13 MED ORDER — PROMETHAZINE HCL 25 MG PO TABS
12.5000 mg | ORAL_TABLET | Freq: Four times a day (QID) | ORAL | Status: DC | PRN
  Administered 2021-08-13 – 2021-08-14 (×2): 25 mg via ORAL
  Filled 2021-08-13 (×3): qty 1

## 2021-08-13 MED ORDER — ADULT MULTIVITAMIN W/MINERALS CH
1.0000 | ORAL_TABLET | Freq: Every day | ORAL | Status: DC
  Administered 2021-08-14 – 2021-08-15 (×2): 1 via ORAL
  Filled 2021-08-13 (×2): qty 1

## 2021-08-13 NOTE — Care Management CC44 (Signed)
Condition Code 44 Documentation Completed  Patient Details  Name: Misty Marshall MRN: [id number] Date of Birth: 02-29-1941   Condition Code 44 given:  Yes Patient signature on Condition Code 44 notice:  Yes Documentation of 2 MD's agreement:  Yes Code 44 added to claim:  Yes    Gerrianne Scale Janely Gullickson, LCSW 08/13/2021, 2:33 PM

## 2021-08-13 NOTE — TOC Initial Note (Signed)
Transition of Care [facility]) - Initial/Assessment Note    Patient Details  Name: Misty Marshall MRN: [id number] Date of Birth: [birth year]  Transition of [facility]) CM/SW Contact:    Eileen Stanford, LCSW Phone Number: 08/13/2021, 2:42 PM  Clinical Narrative:      Pt's daughter Clarene Critchley present at beside. At this time the SNF preference is [facility]. CSW has sent referral.              Expected Discharge Plan: Skilled Nursing Facility Barriers to Discharge: Continued Medical Work up   Patient Goals and CMS Choice Patient states their goals for this hospitalization and ongoing recovery are:: for pt to get better   Choice offered to / list presented to : Sibling  Expected Discharge Plan and Services Expected Discharge Plan: [place] In-house Referral: Clinical Social Work   Post Acute Care Choice: [place] Living arrangements for the past 2 months: [place]                                      Prior Living Arrangements/Services Living arrangements for the past 2 months: Single Family Home Lives with:: Spouse Patient language and need for interpreter reviewed:: Yes Do you feel safe going back to the place where you live?: Yes      Need for Family Participation in Patient Care: Yes (Comment) Care giver support system in place?: Yes (comment)   Criminal Activity/Legal Involvement Pertinent to Current Situation/Hospitalization: No - Comment as needed  Activities of Daily Living Home Assistive Devices/Equipment: Hearing aid ADL Screening (condition at time of admission) Patient's cognitive ability adequate to safely complete daily activities?: Yes Is the patient deaf or have difficulty hearing?: No Does the patient have difficulty seeing, even when wearing glasses/contacts?: No Does the patient have difficulty concentrating, remembering, or making decisions?: No Patient able to express need for assistance with ADLs?: Yes Does the  patient have difficulty dressing or bathing?: No Independently performs ADLs?: Yes (appropriate for developmental age) Does the patient have difficulty walking or climbing stairs?: No Weakness of Legs: None Weakness of Arms/Hands: None  Permission Sought/Granted Permission sought to share information with : Family Supports    Share Information with NAME: Clarene Critchley  Permission granted to share info w AGENCY: [facility]  Permission granted to share info w Relationship: daughter     Emotional Assessment Appearance:: Appears stated age Attitude/Demeanor/Rapport: Engaged Affect (typically observed): Accepting Orientation: : Oriented to  Time, Oriented to Situation, Oriented to Place, Oriented to Self Alcohol / Substance Use: Not Applicable Psych Involvement: No (comment)  Admission diagnosis:  Pelvis fracture ([place]) [S32.9XXA] Closed fracture of right inferior pubic ramus, initial encounter [facility]) [S32.591A] Patient Active Problem List   Diagnosis Date Noted   History of cardiac arrhythmia 08/13/2021   Essential hypertension 08/13/2021   Hyperlipidemia 08/13/2021   GERD (gastroesophageal reflux disease) 08/13/2021   Hyponatremia 08/13/2021   Pelvis fracture ([place]) 08/12/2021   PCP:  [facility]:   [id number] - [place], [place] [address] Phone: [phone] Fax: [phone]     Social Determinants of Health (SDOH) Interventions    Readmission Risk Interventions     View : No data to display.

## 2021-08-13 NOTE — Assessment & Plan Note (Signed)
   Remote hx Afib, sinus here

## 2021-08-13 NOTE — Assessment & Plan Note (Signed)
   Cont home PPI

## 2021-08-13 NOTE — Evaluation (Signed)
Physical Therapy Evaluation Patient Details Name: Misty Marshall MRN: [id number] DOB: 02/05/1941 Today's Date: 08/13/2021  History of Present Illness  Pt is an 81 y/o F admitted on 08/12/21 after presenting after a fall. Pt found to have BLE pubic rami fx & is WBAT BLE. PMH: HLD, HTN, GERD, depression, dizziness  Clinical Impression  Pt seen for PT evaluation with co-tx with OT. Pt reports prior to admission she was independent without AD, cooking, cleaning & driving. On this date, pt endorses significant R hip pain that increases with mobility. Pt requires max assist +2 for supine>sit with HOB elevated & bed rails but transfers to standing with only min assist +2. Pt attempts step pivot to recliner on R with RW & min assist +2 assist with PT educating pt on use of BUE to offload RLE when stepping with LLE. Pt unable to weight bear through RLE enough to step with LLE so she instead scoots foot. Pt c/o nausea with mobility. Pt also reports she plans to d/c to rehab. Pt would benefit from STR upon d/c to maximize independence with functional mobility & reduce fall risk prior to return home. Will continue to follow pt acutely to address balance, strengthening, and gait with LRAD.     Recommendations for follow up therapy are one component of a multi-disciplinary discharge planning process, led by the attending physician.  Recommendations may be updated based on patient status, additional functional criteria and insurance authorization.  Follow Up Recommendations Skilled nursing-short term rehab (<3 hours/day)    Assistance Recommended at Discharge Frequent or constant Supervision/Assistance  Patient can return home with the following  Two people to help with walking and/or transfers;Two people to help with bathing/dressing/bathroom;Help with stairs or ramp for entrance;Assist for transportation;Direct supervision/assist for financial management;Assistance with cooking/housework    Equipment  Recommendations Rolling walker (2 wheels);BSC/3in1  Recommendations for Other Services       Functional Status Assessment Patient has had a recent decline in their functional status and demonstrates the ability to make significant improvements in function in a reasonable and predictable amount of time.     Precautions / Restrictions Precautions Precautions: Fall Restrictions Weight Bearing Restrictions: Yes RLE Weight Bearing: Weight bearing as tolerated LLE Weight Bearing: Weight bearing as tolerated      Mobility  Bed Mobility Overal bed mobility: Needs Assistance Bed Mobility: Supine to Sit     Supine to sit: Max assist, +2 for physical assistance, HOB elevated          Transfers Overall transfer level: Needs assistance Equipment used: Rolling walker (2 wheels) Transfers: Sit to/from Stand, Bed to chair/wheelchair/BSC Sit to Stand: Min assist, +2 physical assistance   Step pivot transfers: Min assist, +2 physical assistance            Ambulation/Gait                  Stairs            Wheelchair Mobility    Modified Rankin (Stroke Patients Only)       Balance Overall balance assessment: Needs assistance Sitting-balance support: Feet supported Sitting balance-Leahy Scale: Fair     Standing balance support: Bilateral upper extremity supported, During functional activity, Reliant on assistive device for balance Standing balance-Leahy Scale: Fair                               Pertinent Vitals/Pain Pain Assessment Pain Assessment: 0-10 Pain  Score:  (6/10 R hip at rest that increases to 10/10 with mobility) Pain Location: R hip Pain Descriptors / Indicators: Grimacing, Discomfort Pain Intervention(s): Monitored during session, Repositioned (nurse reports pt cannot have more pain medication at this time)    [place] expects to be discharged to:: Private residence Living Arrangements: Spouse/significant  other Available Help at Discharge: Family Type of Home: House Home Access: Stairs to enter Entrance Stairs-Rails: Right;Left;Can reach both Technical brewer of Steps: 5   Home Layout: One level        Prior Function Prior Level of Function : Independent/Modified Independent;Driving             Mobility Comments: Independent, driving, no falls ADLs Comments: cooking, cleaning, bathing, dressing independently     Hand Dominance        Extremity/Trunk Assessment   Upper Extremity Assessment Upper Extremity Assessment: Overall WFL for tasks assessed    Lower Extremity Assessment Lower Extremity Assessment: RLE deficits/detail;LLE deficits/detail RLE Deficits / Details: 3-/5 knee extension in sitting; limited by pain LLE Deficits / Details: 3/5 knee extension in sitting; limited by pain       Communication   Communication: No difficulties  Cognition Arousal/Alertness: Awake/alert Behavior During Therapy: WFL for tasks assessed/performed Overall Cognitive Status: Within Functional Limits for tasks assessed                                 General Comments: sweet lady        General Comments General comments (skin integrity, edema, etc.): Pt with c/o nausea throughout session.    Exercises     Assessment/Plan    PT Assessment Patient needs continued PT services  PT Problem List Decreased strength;Cardiopulmonary status limiting activity;Decreased coordination;Pain;Decreased range of motion;Decreased activity tolerance;Decreased knowledge of use of DME;Decreased balance;Decreased safety awareness;Decreased mobility;Decreased knowledge of precautions       PT Treatment Interventions DME instruction;Therapeutic exercise;Gait training;Balance training;Neuromuscular re-education;Stair training;Modalities;Manual techniques;Functional mobility training;Therapeutic activities;Patient/family education    PT Goals (Current goals can be found in the  Care Plan section)  Acute Rehab PT Goals Patient Stated Goal: decreased pain PT Goal Formulation: With patient Time For Goal Achievement: 08/27/21 Potential to Achieve Goals: Good    Frequency 7X/week     Co-evaluation PT/OT/SLP Co-Evaluation/Treatment: Yes Reason for Co-Treatment: For patient/therapist safety;To address functional/ADL transfers (2/2 pt's pain & potentially limited activity tolerance) PT goals addressed during session: Balance;Proper use of DME;Mobility/safety with mobility         AM-PAC PT "6 Clicks" Mobility  Outcome Measure Help needed turning from your back to your side while in a flat bed without using bedrails?: Total Help needed moving from lying on your back to sitting on the side of a flat bed without using bedrails?: Total Help needed moving to and from a bed to a chair (including a wheelchair)?: A Little Help needed standing up from a chair using your arms (e.g., wheelchair or bedside chair)?: A Little Help needed to walk in hospital room?: Total Help needed climbing 3-5 steps with a railing? : Total 6 Click Score: 10    End of Session Equipment Utilized During Treatment: Gait belt Activity Tolerance: Patient limited by pain Patient left: in chair;with chair alarm set;with call bell/phone within reach Nurse Communication: Mobility status PT Visit Diagnosis: Difficulty in walking, not elsewhere classified (R26.2);Unsteadiness on feet (R26.81);Pain;Muscle weakness (generalized) (M62.81) Pain - Right/Left: Right Pain - part of body: Hip  Time: [id number] PT Time Calculation (min) (ACUTE ONLY): 18 min   Charges:   PT Evaluation $PT Eval Moderate Complexity: [place], PT, DPT 08/13/21, 11:11 AM   Waunita Schooner 08/13/2021, 11:09 AM

## 2021-08-13 NOTE — Assessment & Plan Note (Signed)
   On spironolactone at home, cont this, BMP ok

## 2021-08-13 NOTE — Assessment & Plan Note (Signed)
Cont home statin   

## 2021-08-13 NOTE — Evaluation (Signed)
Occupational Therapy Evaluation Patient Details Name: Misty Marshall MRN: [id number] DOB: Aug 11, 1940 Today's Date: 08/13/2021   History of Present Illness Pt is an 81 y/o F admitted on 08/12/21 after presenting after a fall. Pt found to have BLE pubic rami fx & is WBAT BLE. PMH: Afib, HLD, HTN, GERD, depression, PAD, dizziness   Clinical Impression   Pt seen for OT evaluation this date. Session coordinated with PT to address functional/ADL transfers 2/2 pt's pain and potentially limited activity tolerance. Prior to admission, pt was independent in all ADLs/IADLs and functional mobility, living with husband in a 1-story home with 5 STE. Pt currently requires MAX A+2 for bed mobility, MAX A for seated LB dressing, SET-UP assist for seated grooming tasks, and MIN A+2 for step pivot transfers bed>recliner (shuffling LLE) d/t significant pain and decreased activity tolerance. Pt would benefit from additional skilled OT services to maximize return to PLOF and minimize risk of future falls, injury, caregiver burden, and readmission. Upon discharge, recommend SNF.      Recommendations for follow up therapy are one component of a multi-disciplinary discharge planning process, led by the attending physician.  Recommendations may be updated based on patient status, additional functional criteria and insurance authorization.   Follow Up Recommendations  Skilled nursing-short term rehab (<3 hours/day)    Assistance Recommended at Discharge Intermittent Supervision/Assistance  Patient can return home with the following A lot of help with bathing/dressing/bathroom;A lot of help with walking and/or transfers;Assistance with cooking/housework    Functional Status Assessment  Patient has had a recent decline in their functional status and demonstrates the ability to make significant improvements in function in a reasonable and predictable amount of time.  Equipment Recommendations  Other (comment) (defer to  next venue of care)       Precautions / Restrictions Precautions Precautions: Fall Restrictions Weight Bearing Restrictions: Yes RLE Weight Bearing: Weight bearing as tolerated LLE Weight Bearing: Weight bearing as tolerated      Mobility Bed Mobility Overal bed mobility: Needs Assistance Bed Mobility: Supine to Sit     Supine to sit: Max assist, +2 for physical assistance, HOB elevated          Transfers Overall transfer level: Needs assistance Equipment used: Rolling walker (2 wheels) Transfers: Sit to/from Stand, Bed to chair/wheelchair/BSC Sit to Stand: Min assist, +2 physical assistance     Step pivot transfers: Min assist, +2 physical assistance            Balance Overall balance assessment: Needs assistance Sitting-balance support: No upper extremity supported, Feet supported Sitting balance-Leahy Scale: Fair Sitting balance - Comments: fair static sitting balance at EOB   Standing balance support: Bilateral upper extremity supported, During functional activity, Reliant on assistive device for balance Standing balance-Leahy Scale: Poor Standing balance comment: Requires MIN A for step pivot transfers                           ADL either performed or assessed with clinical judgement   ADL Overall ADL's : Needs assistance/impaired     Grooming: Wash/dry face;Set up;Sitting               Lower Body Dressing: Maximal assistance;Sitting/lateral leans Lower Body Dressing Details (indicate cue type and reason): to don socks             Functional mobility during ADLs: Min guard;+2 for physical assistance;Rolling walker (2 wheels)       Vision Baseline Vision/History:  1 Wears glasses Ability to See in Adequate Light: 0 Adequate Patient Visual Report: No change from baseline              Pertinent Vitals/Pain Pain Assessment Pain Assessment: 0-10 Pain Score: 10-Worst pain ever (6/10 R hip at rest that increases to 10/10 with  mobility) Pain Location: R hip Pain Descriptors / Indicators: Grimacing, Discomfort Pain Intervention(s): Monitored during session, Repositioned, Patient requesting pain meds-RN notified (nurse reports pt cannot have more pain medication at this time)        Extremity/Trunk Assessment Upper Extremity Assessment Upper Extremity Assessment: Overall WFL for tasks assessed   Lower Extremity Assessment Lower Extremity Assessment: RLE deficits/detail;LLE deficits/detail RLE Deficits / Details: 3-/5 knee extension in sitting; limited by pain LLE Deficits / Details: 3/5 knee extension in sitting; limited by pain       Communication Communication Communication: No difficulties   Cognition Arousal/Alertness: Awake/alert Behavior During Therapy: WFL for tasks assessed/performed Overall Cognitive Status: Within Functional Limits for tasks assessed                                       General Comments  Pt with c/o nausea throughout session.            Home Living Family/patient expects to be discharged to:: Private residence Living Arrangements: Spouse/significant other Available Help at Discharge: Family Type of Home: House Home Access: Stairs to enter Secretary/administratorntrance Stairs-Number of Steps: 5 Entrance Stairs-Rails: Right;Left;Can reach both Home Layout: One level     Bathroom Shower/Tub: Tub/shower unit         Home Equipment: Grab bars - tub/shower          Prior Functioning/Environment Prior Level of Function : Independent/Modified Independent;Driving             Mobility Comments: Independent, driving, no falls ADLs Comments: cooking, cleaning, bathing, dressing independently        OT Problem List: Decreased range of motion;Decreased activity tolerance;Pain      OT Treatment/Interventions: Self-care/ADL training;DME and/or AE instruction;Therapeutic activities;Patient/family education;Balance training    OT Goals(Current goals can be found  in the care plan section) Acute Rehab OT Goals Patient Stated Goal: to go to rehab OT Goal Formulation: With patient Time For Goal Achievement: 08/27/21 Potential to Achieve Goals: Good ADL Goals Pt Will Perform Grooming: with min guard assist;standing Pt Will Perform Lower Body Dressing: with min assist;with adaptive equipment;sit to/from stand Pt Will Transfer to Toilet: with min guard assist;ambulating;bedside commode  OT Frequency: Min 2X/week    Co-evaluation PT/OT/SLP Co-Evaluation/Treatment: Yes Reason for Co-Treatment: To address functional/ADL transfers;Other (comment) (2/2 pt's pain & potentially limited activity tolerance) PT goals addressed during session: Mobility/safety with mobility;Balance;Proper use of DME OT goals addressed during session: ADL's and self-care      AM-PAC OT "6 Clicks" Daily Activity     Outcome Measure Help from another person eating meals?: None Help from another person taking care of personal grooming?: A Little Help from another person toileting, which includes using toliet, bedpan, or urinal?: A Lot Help from another person bathing (including washing, rinsing, drying)?: A Lot Help from another person to put on and taking off regular upper body clothing?: A Little Help from another person to put on and taking off regular lower body clothing?: A Lot 6 Click Score: 16   End of Session Equipment Utilized During Treatment: Gait belt;Rolling walker (2  wheels) Nurse Communication: Mobility status  Activity Tolerance: Patient limited by pain Patient left: in chair;with call bell/phone within reach;with nursing/sitter in room;with family/visitor present  OT Visit Diagnosis: Unsteadiness on feet (R26.81);Pain;History of falling (Z91.81) Pain - part of body: Hip                Time: 2440-1027 OT Time Calculation (min): 18 min Charges:  OT General Charges $OT Visit: 1 Visit OT Evaluation $OT Eval Moderate Complexity: 1 Mod  Matthew Folks,  OTR/L [facility] [phone]

## 2021-08-13 NOTE — NC FL2 (Signed)
[place] MEDICAID FL2 LEVEL OF CARE SCREENING TOOL     IDENTIFICATION  Patient Name: Misty Marshall Birthdate: 29-May-1940 Sex: female Admission Date (Current Location): 08/12/2021  [facility] and [place] Number:  Chiropodist and Address:  [facility], [address] [place], [place] [address]      Provider Number: [id number]  Attending Physician Name and Address:  Sunnie Nielsen, DO  Relative Name and Phone Number:       Current Level of Care: Hospital Recommended Level of Care: Skilled Nursing Facility Prior Approval Number:    Date Approved/Denied:   PASRR Number: [id number]  Discharge Plan: SNF    Current Diagnoses: Patient Active Problem List   Diagnosis Date Noted   History of cardiac arrhythmia 08/13/2021   Essential hypertension 08/13/2021   Hyperlipidemia 08/13/2021   GERD (gastroesophageal reflux disease) 08/13/2021   Hyponatremia 08/13/2021   Pelvis fracture (HCC) 08/12/2021    Orientation RESPIRATION BLADDER Height & Weight     Self, Situation, Place, Time  Normal Continent Weight: 128 lb (58.1 kg) Height:  5\' 3"  (160 cm)  BEHAVIORAL SYMPTOMS/MOOD NEUROLOGICAL BOWEL NUTRITION STATUS      Continent Diet (regular diet)  AMBULATORY STATUS COMMUNICATION OF NEEDS Skin   Limited Assist Verbally Normal                       Personal Care Assistance Level of Assistance  Feeding, Bathing, Dressing Bathing Assistance: Limited assistance Feeding assistance: Independent Dressing Assistance: Limited assistance     Functional Limitations Info  Sight, Hearing, Speech Sight Info: Adequate Hearing Info: Adequate Speech Info: Adequate    SPECIAL CARE FACTORS FREQUENCY  PT (By licensed PT), OT (By licensed OT)     PT Frequency: 5x OT Frequency: 5x            Contractures Contractures Info: Not present    Additional Factors Info  Code Status, Allergies Code Status Info: Full Code Allergies Info:  Alendronate, Celecoxib, Codeine, Risedronate, Simvastatin, Trazodone, Sulfa Antibiotics           Current Medications (08/13/2021):  This is the current hospital active medication list Current Facility-Administered Medications  Medication Dose Route Frequency Provider Last Rate Last Admin   ALPRAZolam 08/15/2021) tablet 0.5 mg  0.5 mg Oral BID PRN Prudy Feeler A, MD   0.5 mg at 08/12/21 2211   aspirin EC tablet 81 mg  81 mg Oral Daily 2212 A, MD   81 mg at 08/13/21 0901   atorvastatin (LIPITOR) tablet 40 mg  40 mg Oral Daily 08/15/21 A, MD   40 mg at 08/13/21 0901   docusate sodium (COLACE) capsule 100 mg  100 mg Oral BID 08/15/21 A, MD   100 mg at 08/13/21 0901   enoxaparin (LOVENOX) injection 40 mg  40 mg Subcutaneous Q24H 08/15/21 A, MD   40 mg at 08/12/21 1705   fentaNYL (SUBLIMAZE) injection 50 mcg  50 mcg Intravenous Q30 min PRN 08/14/21, MD   50 mcg at 08/12/21 1212   HYDROcodone-acetaminophen (NORCO/VICODIN) 5-325 MG per tablet 1-2 tablet  1-2 tablet Oral Q6H PRN 08/14/21, MD   1 tablet at 08/13/21 1330   HYDROmorphone (DILAUDID) injection 0.5 mg  0.5 mg Intravenous Q4H PRN 08/15/21 A, MD   0.5 mg at 08/12/21 2351   meloxicam (MOBIC) tablet 15 mg  15 mg Oral Daily 08/14/21, MD   15 mg at 08/13/21 [phone]  multivitamin with minerals tablet 1 tablet  1 tablet Oral Daily Sunnie Nielsen, DO       pantoprazole (PROTONIX) EC tablet 40 mg  40 mg Oral Daily Skip Mayer A, MD   40 mg at 08/13/21 0901   promethazine (PHENERGAN) tablet 12.5-25 mg  12.5-25 mg Oral Q6H PRN Sunnie Nielsen, DO   25 mg at 08/13/21 1148   senna-docusate (Senokot-S) tablet 1 tablet  1 tablet Oral QHS PRN Lurline Del, MD       spironolactone (ALDACTONE) tablet 25 mg  25 mg Oral Daily Lurline Del, MD   25 mg at 08/13/21 0901     Discharge Medications: Please see discharge summary for a list of discharge  medications.  Relevant Imaging Results:  Relevant Lab Results:   Additional Information SSN:[id number]  Maree Krabbe, LCSW

## 2021-08-13 NOTE — Progress Notes (Signed)
Nutrition Brief Note  RD consulted for assessment of nutritional requirements/ status.   Wt Readings from Last 15 Encounters:  08/12/21 58.1 kg  04/26/21 58.5 kg  10/04/17 55.8 kg   Pt with medical history significant of Afib, HTN, HLD,GERD, Depression,PAD who presents s/p mechanical fall.  Pt admitted with acute rt and lt pelvic fractures s/p fall.   Spoke with pt at bedside, who was pleasant and in good spirits today. She reports that she has a good appetite and usually consumes 3 meals per day (Breakfast: toast and eggs; lunch: sandwich; dinner: meat, starch, and vegetable). Pt reports she often cooks her meals, but will sometimes go out to a family restaurant, such as [employer], clinical. Pt prides herself on being independent, stating "I'm an 81 year old with a mindset of a 81 year old; you just have to stay positive". She consumed about 75% of her breakfast this morning.   Pt denies any weight loss. Reviewed wt hx; wt has been stable over the past 4 months.  Discussed importance of good meal intake to promote healing. Pt with no further questions or concerns, but expressed appreciation for visit.   Nutrition-Focused physical exam completed. Findings are no fat depletion, no muscle depletion, and no edema.     Medications reviewed and include colace.   Labs reviewed: Na: 133.   Current diet order is Heart Healthy (will liberalize to regular for wider variety of meal selections), patient is consuming approximately 75% of meals at this time. Labs and medications reviewed.   No nutrition interventions warranted at this time. If nutrition issues arise, please consult RD.   Levada Schilling, RD, LDN, CDCES Registered Dietitian II Certified Diabetes Care and Education Specialist Please refer to [facility] for RD and/or RD on-call/weekend/after hours pager

## 2021-08-13 NOTE — Assessment & Plan Note (Signed)
   Sodium with slight improvement to 130 today.  Hyponatremia labs with decreased urine and serum osmolality and urinary sodium less than 10.  Consistent with hypoosmolar hyponatremia.  Patient needs to increase salt intake and restrict free water.  Per patient she recently stopped taking salt and was drinking whole lot of free water.  Will need monitoring which can be done as an outpatient.

## 2021-08-13 NOTE — Plan of Care (Signed)
  Problem: Education: Goal: Knowledge of General Education information will improve Description: Including pain rating scale, medication(s)/side effects and non-pharmacologic comfort measures Outcome: Progressing   Problem: Health Behavior/Discharge Planning: Goal: Ability to manage health-related needs will improve Outcome: Progressing   Problem: Clinical Measurements: Goal: Ability to maintain clinical measurements within normal limits will improve Outcome: Progressing Goal: Will remain free from infection Outcome: Progressing Goal: Diagnostic test results will improve Outcome: Progressing   Problem: Activity: Goal: Risk for activity intolerance will decrease Outcome: Progressing   Problem: Nutrition: Goal: Adequate nutrition will be maintained Outcome: Progressing   Problem: Pain Managment: Goal: General experience of comfort will improve Outcome: Progressing   Problem: Safety: Goal: Ability to remain free from injury will improve Outcome: Progressing   

## 2021-08-13 NOTE — Progress Notes (Addendum)
PROGRESS NOTE    Misty Marshall  GNF:[id number] DOB: 14-Sep-1940  DOA: 08/12/2021 Date of Service: 08/13/21 PCP: Marlan Palau     Brief Narrative / Hospital Course:  Misty Marshall is a 81 y.o. female with medical history significant of Afib, HTN, HLD,GERD, Depression,PAD Who presents to ED 08/12/21 s/p mechanical fall at church after she broke a church member fall, s/p which patient  fell.  After patient fall she noted hip pain and was unable to ambulate. Abn XR hip. CT Pelvis shpwed Minimally displaced right inferior pubic ramus and nondisplaced right superior pubic ramus, right pubic body, and left pubic body acute fractures. Admitted for pain control, PT/OT recommending SNF and placement pending  Consultants:  none  Procedures: none    Subjective: Patient reports pain this morning is improved and tolerable on medications but still present. NO CP/SOB.      ASSESSMENT & PLAN:   Principal Problem:   Pelvis fracture (HCC) Active Problems:   History of cardiac arrhythmia   Essential hypertension   Hyperlipidemia   GERD (gastroesophageal reflux disease)   Pelvis fracture (HCC) PT/OT recommending SNF, placement pending  Supportive pain medications  Weight bearing as tolerated  Hyperlipidemia Cont home statin   GERD (gastroesophageal reflux disease) Cont home PPI  Essential hypertension On spironolactone at home, cont this, BMP ok  History of cardiac arrhythmia Remote hx Afib, sinus here      DVT prophylaxis: lovenox Code Status: FULL Family Communication: daughter at bedside on rounds  Disposition Plan / TOC needs: patient is observation status, PT/OT recommending SNF Barriers to discharge / significant pending items: penind ANF placement              Objective: Vitals:   08/12/21 1810 08/12/21 2020 08/13/21 0332 08/13/21 0717  BP: (!) 149/75 (!) 128/57 105/70 102/72  Pulse: 69 65 76 81  Resp: Temp: 97.9 F (36.6 C) 98.2  F (36.8 C) 98.3 F (36.8 C) 98.5 F (36.9 C)  TempSrc: Oral Oral    SpO2: 98% 95% 96% 98%  Weight:      Height:        Intake/Output Summary (Last 24 hours) at 08/13/2021 1427 Last data filed at 08/13/2021 0900 Gross per 24 hour  Intake 240 ml  Output 0 ml  Net 240 ml   Filed Weights   08/12/21 1159  Weight: 58.1 kg    Examination:  Constitutional:  VS as above General Appearance: alert, well-developed, well-nourished, NAD Eyes: Normal lids and conjunctive, non-icteric sclera Ears, Nose, Mouth Normal appearance Neck: No masses, trachea midline Respiratory: Normal respiratory effort Breath sounds normal, no wheeze/rhonchi/rales Cardiovascular: S1/S2 normal, no murmur/rub/gallop auscultated No lower extremity edema Gastrointestinal: Nontender, no masses Musculoskeletal:  No clubbing/cyanosis of digits Neurological: No cranial nerve deficit on limited exam Psychiatric: Normal judgment/insight Normal mood and affect       Scheduled Medications:   aspirin EC  81 mg Oral Daily   atorvastatin  40 mg Oral Daily   docusate sodium  100 mg Oral BID   enoxaparin (LOVENOX) injection  40 mg Subcutaneous Q24H   meloxicam  15 mg Oral Daily   multivitamin with minerals  1 tablet Oral Daily   pantoprazole  40 mg Oral Daily   spironolactone  25 mg Oral Daily    Continuous Infusions:   PRN Medications:  ALPRAZolam, fentaNYL (SUBLIMAZE) injection, HYDROcodone-acetaminophen, HYDROmorphone (DILAUDID) injection, promethazine, senna-docusate  Antimicrobials:  Anti-infectives (From admission, onward)    None  Data Reviewed: I have personally reviewed following labs and imaging studies  CBC: Recent Labs  Lab 08/12/21 1401 08/13/21 0423  WBC 9.8 9.3  NEUTROABS 7.8*  --   HGB 13.1 12.7  HCT 39.4 38.0  MCV 96.1 96.0  PLT 190 180   Basic Metabolic Panel: Recent Labs  Lab 08/12/21 1401 08/13/21 0423  NA 141 133*  K 3.6 3.8  CL 105 99  CO2 27 28   GLUCOSE 86 101*  BUN 17 21  CREATININE 0.74 0.92  CALCIUM 9.0 8.6*   GFR: Estimated Creatinine Clearance: 39 mL/min (by C-G formula based on SCr of 0.92 mg/dL). Liver Function Tests: Recent Labs  Lab 08/12/21 1401  AST 25  ALT 17  ALKPHOS 48  BILITOT 1.0  PROT 6.1*  ALBUMIN 3.6   No results for input(s): LIPASE, AMYLASE in the last 168 hours. No results for input(s): AMMONIA in the last 168 hours. Coagulation Profile: No results for input(s): INR, PROTIME in the last 168 hours. Cardiac Enzymes: No results for input(s): CKTOTAL, CKMB, CKMBINDEX, TROPONINI in the last 168 hours. BNP (last 3 results) No results for input(s): PROBNP in the last 8760 hours. HbA1C: No results for input(s): HGBA1C in the last 72 hours. CBG: No results for input(s): GLUCAP in the last 168 hours. Lipid Profile: No results for input(s): CHOL, HDL, LDLCALC, TRIG, CHOLHDL, LDLDIRECT in the last 72 hours. Thyroid Function Tests: No results for input(s): TSH, T4TOTAL, FREET4, T3FREE, THYROIDAB in the last 72 hours. Anemia Panel: No results for input(s): VITAMINB12, FOLATE, FERRITIN, TIBC, IRON, RETICCTPCT in the last 72 hours. Urine analysis:    Component Value Date/Time   COLORURINE YELLOW (A) 08/13/2021 0645   APPEARANCEUR CLEAR (A) 08/13/2021 0645   LABSPEC 1.023 08/13/2021 0645   PHURINE 5.0 08/13/2021 0645   GLUCOSEU NEGATIVE 08/13/2021 0645   HGBUR NEGATIVE 08/13/2021 0645   BILIRUBINUR NEGATIVE 08/13/2021 0645   KETONESUR 5 (A) 08/13/2021 0645   PROTEINUR NEGATIVE 08/13/2021 0645   NITRITE NEGATIVE 08/13/2021 0645   LEUKOCYTESUR TRACE (A) 08/13/2021 0645   Sepsis Labs: @LABRCNTIP (procalcitonin:4,lacticidven:4)  No results found for this or any previous visit (from the past 240 hour(s)).       Radiology Studies last 96 hours: CT HEAD WO CONTRAST (5MM)  Result Date: 08/12/2021 CLINICAL DATA:  Neck trauma.  Status post fall. EXAM: CT HEAD WITHOUT CONTRAST CT CERVICAL SPINE  WITHOUT CONTRAST TECHNIQUE: Multidetector CT imaging of the head and cervical spine was performed following the standard protocol without intravenous contrast. Multiplanar CT image reconstructions of the cervical spine were also generated. RADIATION DOSE REDUCTION: This exam was performed according to the departmental dose-optimization program which includes automated exposure control, adjustment of the mA and/or kV according to patient size and/or use of iterative reconstruction technique. COMPARISON:  None Available. FINDINGS: CT HEAD FINDINGS Brain: No evidence of acute infarction, hemorrhage, hydrocephalus, extra-axial collection or mass lesion/mass effect. There is mild diffuse low-attenuation within the subcortical and periventricular white matter compatible with chronic microvascular disease. Vascular: No hyperdense vessel or unexpected calcification. Skull: No acute abnormality. Remote healed deformity involving the left mandibular condyle. Sinuses/Orbits: Paranasal sinuses and mastoid air cells are clear. Other: None CT CERVICAL SPINE FINDINGS Alignment: No signs of acute posttraumatic mild alignment of the cervical spine. Chronic anterolisthesis C4 on C5 is identified measuring 2 mm, image 42/5. Skull base and vertebrae: No acute fracture. No primary bone lesion or focal pathologic process. Soft tissues and spinal canal: No prevertebral fluid or swelling. No  visible canal hematoma. Disc levels: Mild multilevel disc space narrowing is identified involving C3-4, C4-5 and C5-6 Upper chest: Negative. Other: None IMPRESSION: 1. No acute intracranial abnormality. 2. 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 3. No evidence for acute cervical spine fracture or subluxation. 4. Cervical spondylosis. Electronically Signed   By: Signa Kell M.D.   On: 08/12/2021 13:33   CT Cervical Spine Wo Contrast  Result Date: 08/12/2021 CLINICAL DATA:  Neck trauma.  Status post fall. EXAM: CT HEAD WITHOUT CONTRAST CT  CERVICAL SPINE WITHOUT CONTRAST TECHNIQUE: Multidetector CT imaging of the head and cervical spine was performed following the standard protocol without intravenous contrast. Multiplanar CT image reconstructions of the cervical spine were also generated. RADIATION DOSE REDUCTION: This exam was performed according to the departmental dose-optimization program which includes automated exposure control, adjustment of the mA and/or kV according to patient size and/or use of iterative reconstruction technique. COMPARISON:  None Available. FINDINGS: CT HEAD FINDINGS Brain: No evidence of acute infarction, hemorrhage, hydrocephalus, extra-axial collection or mass lesion/mass effect. There is mild diffuse low-attenuation within the subcortical and periventricular white matter compatible with chronic microvascular disease. Vascular: No hyperdense vessel or unexpected calcification. Skull: No acute abnormality. Remote healed deformity involving the left mandibular condyle. Sinuses/Orbits: Paranasal sinuses and mastoid air cells are clear. Other: None CT CERVICAL SPINE FINDINGS Alignment: No signs of acute posttraumatic mild alignment of the cervical spine. Chronic anterolisthesis C4 on C5 is identified measuring 2 mm, image 42/5. Skull base and vertebrae: No acute fracture. No primary bone lesion or focal pathologic process. Soft tissues and spinal canal: No prevertebral fluid or swelling. No visible canal hematoma. Disc levels: Mild multilevel disc space narrowing is identified involving C3-4, C4-5 and C5-6 Upper chest: Negative. Other: None IMPRESSION: 1. No acute intracranial abnormality. 2. 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 3. No evidence for acute cervical spine fracture or subluxation. 4. Cervical spondylosis. Electronically Signed   By: Signa Kell M.D.   On: 08/12/2021 13:33   CT PELVIS WO CONTRAST  Result Date: 08/12/2021 CLINICAL DATA:  Hip trauma.  Fracture suspected. EXAM: CT PELVIS WITHOUT  CONTRAST TECHNIQUE: Multidetector CT imaging of the pelvis was performed following the standard protocol without intravenous contrast. RADIATION DOSE REDUCTION: This exam was performed according to the departmental dose-optimization program which includes automated exposure control, adjustment of the mA and/or kV according to patient size and/or use of iterative reconstruction technique. COMPARISON:  Pelvis and right hip radiographs 08/12/2021; CT abdomen and pelvis 09/07/2019 FINDINGS: Urinary Tract:  No focal urinary bladder wall thickening. Bowel: Moderate to high-grade sigmoid diverticulosis. No inflammatory changes to indicate acute diverticulitis. Vascular/Lymphatic: Minimally partially visualized atherosclerotic calcifications at the aortic-iliac bifurcation. Reproductive: The uterus appears to be surgically absent. No gross adnexal abnormality is seen. Other: Moderate left-greater-than-right chronic enthesopathic changes at the bilateral common hamstring tendon origins off the ischial tuberosities. Musculoskeletal: There is a nondisplaced linear vertical oriented acute fracture within the superolateral aspect of the right pubic ramus (coronal series 9, image 40 and axial series 4 images 30 and 31). There is an oblique, acute fracture of the right inferior pubic ramus with mild 3 mm lateral displacement of the anterior fracture component respect of the posterior fracture component (axial series 4, image 41 and coronal series 9, image 58). There is an acute, nondisplaced vertically oriented fracture within the left pubic body (axial series 4, image 34, sagittal series 10, image 126, coronal series 9, image 31). Minimal 1 mm cortical step-off  of the superior aspect of the right pubic body, likely an additional tiny fracture (coronal series 9, image 34). Small sclerotic bone island within the superomedial left acetabulum, similar to 09/07/2019 CT. Moderate to severe bilateral femoroacetabular joint space  narrowing. The bilateral sacroiliac joint spaces are maintained. IMPRESSION: Minimally displaced right inferior pubic ramus and nondisplaced right superior pubic ramus, right pubic body, and left pubic body acute fractures. Electronically Signed   By: Neita Garnet M.D.   On: 08/12/2021 14:42   DG Chest Portable 1 View  Result Date: 08/12/2021 CLINICAL DATA:  Status post fall. EXAM: PORTABLE CHEST 1 VIEW COMPARISON:  None Available. FINDINGS: The lungs are clear without focal pneumonia, edema, pneumothorax or pleural effusion. Streaky opacity in the medial right base is probably related to atelectasis or scarring the cardiopericardial silhouette is within normal limits for size. Interstitial markings are diffusely coarsened with chronic features. Bones are diffusely demineralized. IMPRESSION: Hyperexpansion with chronic interstitial coarsening compatible with emphysema. Streaky opacity in the infrahilar right base likely atelectatic or scarring. Electronically Signed   By: Kennith Center M.D.   On: 08/12/2021 13:06   DG Hip Unilat  With Pelvis 2-3 Views Right  Result Date: 08/12/2021 CLINICAL DATA:  Right hip pain following a fall today. EXAM: DG HIP (WITH OR WITHOUT PELVIS) 2-3V RIGHT COMPARISON:  None Available. FINDINGS: Normal appearing right hip without fracture or dislocation. There are essentially nondisplaced acute fractures of the right superior and inferior pubic rami. No other fractures and no dislocations. IMPRESSION: Essentially nondisplaced acute fractures of the right superior and inferior pubic rami. Electronically Signed   By: Beckie Salts M.D.   On: 08/12/2021 12:43            LOS: 1 day    Time spent: 35 min    Sunnie Nielsen, DO [facility] 08/13/2021, 2:27 PM   Staff may message me via secure chat in Epic  but this may not receive immediate response,  please page for urgent matters!  If 7PM-7AM, please contact night-coverage [email or web address]  Dictation  software was used to generate the above note. Typos may occur and escape review, as with typed/written notes. Please contact Dr Lyn Hollingshead directly for clarity if needed.

## 2021-08-13 NOTE — Assessment & Plan Note (Signed)
   PT/OT recommending SNF, placement pending   Supportive pain medications   Weight bearing as tolerated  Increased ASA from 81 mg to 162 mg, daily for DVT ppx until healed / ambulatory

## 2021-08-14 ENCOUNTER — Encounter: Payer: Self-pay | Admitting: Internal Medicine

## 2021-08-14 DIAGNOSIS — S329XXA Fracture of unspecified parts of lumbosacral spine and pelvis, initial encounter for closed fracture: Secondary | ICD-10-CM | POA: Diagnosis not present

## 2021-08-14 LAB — BASIC METABOLIC PANEL
Anion gap: 7 (ref 5–15)
BUN: 22 mg/dL (ref 8–23)
CO2: 26 mmol/L (ref 22–32)
Calcium: 8.1 mg/dL — ABNORMAL LOW (ref 8.9–10.3)
Chloride: 94 mmol/L — ABNORMAL LOW (ref 98–111)
Creatinine, Ser: 0.77 mg/dL (ref 0.44–1.00)
GFR, Estimated: >60 mL/min (ref >60)
Glucose, Bld: 100 mg/dL — ABNORMAL HIGH (ref 70–99)
Potassium: 3.5 mmol/L (ref 3.5–5.1)
Sodium: 127 mmol/L — ABNORMAL LOW (ref 135–145)

## 2021-08-14 LAB — SODIUM, URINE, RANDOM: Sodium, Ur: <10 mmol/L

## 2021-08-14 LAB — OSMOLALITY: Osmolality: 267 mOsm/kg — ABNORMAL LOW (ref 275–295)

## 2021-08-14 LAB — OSMOLALITY, URINE: Osmolality, Ur: 193 mOsm/kg — ABNORMAL LOW (ref 300–900)

## 2021-08-14 MED ORDER — HYDROCODONE-ACETAMINOPHEN 5-325 MG PO TABS: 1.0000 | ORAL_TABLET | Freq: Four times a day (QID) | ORAL | 0 refills | Status: AC | PRN

## 2021-08-14 MED ORDER — DOCUSATE SODIUM 100 MG PO CAPS
100.0000 mg | ORAL_CAPSULE | Freq: Two times a day (BID) | ORAL | 0 refills | Status: AC
Start: 2021-08-14 — End: ?

## 2021-08-14 MED ORDER — MAGNESIUM HYDROXIDE 400 MG/5ML PO SUSP
30.0000 mL | Freq: Every day | ORAL | Status: DC | PRN
  Administered 2021-08-14: 30 mL via ORAL
  Filled 2021-08-14: qty 30

## 2021-08-14 MED ORDER — ASPIRIN EC 81 MG PO TBEC
162.0000 mg | DELAYED_RELEASE_TABLET | Freq: Every day | ORAL | 0 refills | Status: AC
Start: 2021-08-14 — End: ?

## 2021-08-14 MED ORDER — DIPHENHYDRAMINE HCL 25 MG PO CAPS
25.0000 mg | ORAL_CAPSULE | Freq: Once | ORAL | Status: AC
  Administered 2021-08-14: 25 mg via ORAL
  Filled 2021-08-14: qty 1

## 2021-08-14 MED ORDER — SODIUM CHLORIDE 0.9 % IV SOLN
INTRAVENOUS | Status: DC
Start: 2021-08-14 — End: 2021-08-15

## 2021-08-14 NOTE — TOC Progression Note (Signed)
Transition of Care [facility]) - Progression Note    Patient Details  Name: Misty Marshall MRN: [id number] Date of Birth: 1941-02-27  [facility] of [facility] CM/SW [place], RN Phone Number: 08/14/2021, 10:05 AM  Clinical Narrative:     Patient to go to [facility] in [place] once Ins Josem Kaufmann is approved, Tammy at [facility] started the Auth process  Expected Discharge Plan: [place] Barriers to Discharge: Continued Medical Work up  Expected Discharge Plan and Services Expected Discharge Plan: [place] In-house Referral: Clinical Social Work   Post Acute Care Choice: [place] Living arrangements for the past 2 months: Single Family Home                                       Social Determinants of Health (SDOH) Interventions    Readmission Risk Interventions     View : No data to display.

## 2021-08-14 NOTE — Progress Notes (Signed)
Physical Therapy Treatment Patient Details Name: Misty Marshall MRN: [id number] DOB: 03-30-1940 Today's Date: 08/14/2021   History of Present Illness Pt is an 81 y/o F admitted on 08/12/21 after presenting after a fall. Pt found to have BLE pubic rami fx & is WBAT BLE. PMH: Afib, HLD, HTN, GERD, depression, PAD, dizziness    PT Comments    Physical Therapy treatment completed this date. Patient tolerated session fairly well and was agreeable to treatment. Upon entering room patient reported 5-6/10 pain in B hips (R>L). Increased pain noted with activity. Patient continues to require increased assistance with supine to sit bed mobility- MAX +2. Once in sitting patient continues to demonstrate fair sitting balance, and only required Min A +2 for sit to stand transfer and step pivot transfer. ~10 small lateral and backwards steps completed to transfer from EOB to recliner. Patient was able to anteriorly/posteriorly scoot to reposition herself in the chair with increased time and effort. Patient was left in recliner with all needs met and in reach with RN and family present. Patient would continue to benefit from skilled physical therapy in order to optimize patients return to PLOF. Continue to recommend STR upon discharge from acute hospitalization.    Recommendations for follow up therapy are one component of a multi-disciplinary discharge planning process, led by the attending physician.  Recommendations may be updated based on patient status, additional functional criteria and insurance authorization.  Follow Up Recommendations  Skilled nursing-short term rehab (<3 hours/day)     Assistance Recommended at Discharge Frequent or constant Supervision/Assistance  Patient can return home with the following Two people to help with walking and/or transfers;Two people to help with bathing/dressing/bathroom;Help with stairs or ramp for entrance;Assist for transportation;Direct supervision/assist for  financial management;Assistance with cooking/housework   Equipment Recommendations  Rolling walker (2 wheels);BSC/3in1    Recommendations for Other Services       Precautions / Restrictions Precautions Precautions: Fall Restrictions Weight Bearing Restrictions: Yes RLE Weight Bearing: Weight bearing as tolerated LLE Weight Bearing: Weight bearing as tolerated     Mobility  Bed Mobility Overal bed mobility: Needs Assistance Bed Mobility: Supine to Sit     Supine to sit: Max assist, +2 for physical assistance, HOB elevated       Patient Response: Cooperative, Anxious  Transfers Overall transfer level: Needs assistance Equipment used: Rolling walker (2 wheels) Transfers: Sit to/from Stand Sit to Stand: Min assist, +2 physical assistance   Step pivot transfers: Min assist, +2 physical assistance            Ambulation/Gait               General Gait Details: defer to step pivot transfer   Stairs             Wheelchair Mobility    Modified Rankin (Stroke Patients Only)       Balance Overall balance assessment: Needs assistance Sitting-balance support: Feet supported, Bilateral upper extremity supported Sitting balance-Leahy Scale: Fair Sitting balance - Comments: fair static sitting balance at EOB   Standing balance support: Bilateral upper extremity supported, During functional activity, Reliant on assistive device for balance Standing balance-Leahy Scale: Poor Standing balance comment: Requires MIN A for step pivot transfers                            Cognition Arousal/Alertness: Awake/alert Behavior During Therapy: WFL for tasks assessed/performed Overall Cognitive Status: Within Functional Limits for tasks assessed  General Comments: sweet lady        Exercises      General Comments General comments (skin integrity, edema, etc.): patient complained of nausea following  step pivot transfer      Pertinent Vitals/Pain Pain Assessment Pain Assessment: 0-10 Pain Score: 6  Pain Location: B hips (R>L) Pain Descriptors / Indicators: Grimacing, Discomfort Pain Intervention(s): Monitored during session, Repositioned, Limited activity within patient's tolerance    Home Living                          Prior Function            PT Goals (current goals can now be found in the care plan section) Acute Rehab PT Goals Patient Stated Goal: decreased pain PT Goal Formulation: With patient Time For Goal Achievement: 08/27/21 Potential to Achieve Goals: Good Progress towards PT goals: Progressing toward goals    Frequency    7X/week      PT Plan Current plan remains appropriate    Co-evaluation              AM-PAC PT "6 Clicks" Mobility   Outcome Measure  Help needed turning from your back to your side while in a flat bed without using bedrails?: Total Help needed moving from lying on your back to sitting on the side of a flat bed without using bedrails?: Total Help needed moving to and from a bed to a chair (including a wheelchair)?: A Little Help needed standing up from a chair using your arms (e.g., wheelchair or bedside chair)?: A Little Help needed to walk in hospital room?: Total Help needed climbing 3-5 steps with a railing? : Total 6 Click Score: 10    End of Session Equipment Utilized During Treatment: Gait belt Activity Tolerance: Patient limited by pain Patient left: in chair;with chair alarm set;with call bell/phone within reach;with family/visitor present Nurse Communication: Mobility status PT Visit Diagnosis: Difficulty in walking, not elsewhere classified (R26.2);Unsteadiness on feet (R26.81);Pain;Muscle weakness (generalized) (M62.81) Pain - Right/Left: Right Pain - part of body: Hip     Time: 0981-1914 PT Time Calculation (min) (ACUTE ONLY): 15 min  Charges:  $Therapeutic Activity: 8-22 mins                      Iva Boop, PT  08/14/21. 11:13 AM

## 2021-08-14 NOTE — Discharge Summary (Addendum)
Physician Discharge Summary   Patient: Misty SaleBarbara Marshall MRN: [id number] DOB: 07-17-1940  Admit date:     08/12/2021  Discharge date: Roger KillBD, stab;e fir discharge 08/15/2021  Discharge Physician: Arnetha CourserSumayya Evanne Matsunaga   PCP: Marlan PalauGard, Allison   Recommendations at discharge:  Follow with PCP or orthopedics in 1 week. Will need repeat Xray at that time to ensure routine healing / no displacement. Seek medical attention sooner if severe increased pain, weakness or loss of sensation in lower extremities, new bowel/bladder incontinence, or other concerns.  Encourage regular salt intake and limit free water, she can use rehydration fluid instead of free water to help with hyponatremia. Please obtain BMP on Friday to monitor sodium.  Discharge Diagnoses: Principal Problem:   Pelvis fracture (HCC) Active Problems:   History of cardiac arrhythmia   Essential hypertension   Hyperlipidemia   GERD (gastroesophageal reflux disease)   Hyponatremia   Hospital Course: Misty SaleBarbara Marshall is a 81 y.o. female with medical history significant of Afib, HTN, HLD,GERD, Depression,PAD Who presents to ED 08/12/21 s/p mechanical fall at church after she broke a church member fall, s/p which patient  fell.  After patient fall she noted hip pain and was unable to ambulate. Abn XR hip. CT Pelvis shpwed Minimally displaced right inferior pubic ramus and nondisplaced right superior pubic ramus, right pubic body, and left pubic body acute fractures. Admitted for pain control, PT/OT recommending SNF and placement pending  Assessment and Plan: * Pelvis fracture [facility](HCC) PT/OT recommending SNF, placement pending  Supportive pain medications  Weight bearing as tolerated Increased ASA from 81 mg to 162 mg, daily for DVT ppx until healed / ambulatory   Hyponatremia Sodium with slight improvement to 130 today.  Hyponatremia labs with decreased urine and serum osmolality and urinary sodium less than 10.  Consistent with hypoosmolar  hyponatremia. Patient needs to increase salt intake and restrict free water. Per patient she recently stopped taking salt and was drinking whole lot of free water. Will need monitoring which can be done as an outpatient.  GERD (gastroesophageal reflux disease) Cont home PPI  Hyperlipidemia Cont home statin   Essential hypertension On spironolactone at home, cont this, BMP ok  History of cardiac arrhythmia Remote hx Afib, sinus here    Pain control - [facility] Controlled Substance Reporting System database was reviewed. and patient was instructed, not to drive, operate heavy machinery, perform activities at heights, swimming or participation in water activities or provide baby-sitting services while on Pain, Sleep and Anxiety Medications; until their outpatient Physician has advised to do so again. Also recommended to not to take more than prescribed Pain, Sleep and Anxiety Medications.  Consultants: none Procedures performed: none  Disposition: Skilled nursing facility Diet recommendation: Regular Discharge Diet Orders (From admission, onward)     Start     Ordered   08/14/21 0000  Diet - low sodium heart healthy        08/14/21 0801           Cardiac diet DISCHARGE MEDICATION: Allergies as of 08/14/2021       Reactions   Alendronate Other (See Comments)   Difficulty swallowing, stomach pain   Celecoxib Other (See Comments)   Stomach pain   Codeine Other (See Comments)   Other Reaction: Not Assessed   Risedronate Other (See Comments)   diffculty swallowing, stomach pain   Simvastatin Other (See Comments)   Trazodone Other (See Comments)   Sulfa Antibiotics Hives, Rash     Med Rec must be  completed prior to using this SMARTLINK     Current Facility-Administered Medications (Cardiovascular):    atorvastatin (LIPITOR) tablet 40 mg   spironolactone (ALDACTONE) tablet 25 mg   Current Facility-Administered Medications (Respiratory):    promethazine  (PHENERGAN) tablet 12.5-25 mg   Current Facility-Administered Medications (Analgesics):    aspirin EC tablet 81 mg   fentaNYL (SUBLIMAZE) injection 50 mcg   HYDROcodone-acetaminophen (NORCO/VICODIN) 5-325 MG per tablet 1-2 tablet   HYDROmorphone (DILAUDID) injection 0.5 mg   meloxicam (MOBIC) tablet 15 mg  Current Outpatient Medications (Analgesics):    aspirin EC 81 MG tablet, Take 2 tablets (162 mg total) by mouth daily.   HYDROcodone-acetaminophen (NORCO/VICODIN) 5-325 MG tablet, Take 1-2 tablets by mouth every 6 (six) hours as needed for moderate pain or severe pain.  Current Facility-Administered Medications (Hematological):    enoxaparin (LOVENOX) injection 40 mg   Current Facility-Administered Medications (Other):    ALPRAZolam (XANAX) tablet 0.5 mg   docusate sodium (COLACE) capsule 100 mg   magnesium hydroxide (MILK OF MAGNESIA) suspension 30 mL   multivitamin with minerals tablet 1 tablet   pantoprazole (PROTONIX) EC tablet 40 mg   senna-docusate (Senokot-S) tablet 1 tablet  Current Outpatient Medications (Other):    ALPRAZolam (XANAX) 0.5 MG tablet, Take 1 tablet (0.5 mg total) by mouth 2 (two) times daily as needed for anxiety.   docusate sodium (COLACE) 100 MG capsule, Take 1 capsule (100 mg total) by mouth 2 (two) times daily.       Contact information for after-discharge care     Destination     [facility] Preferred SNF .   Service: Skilled Nursing Contact information: [address] [place] [address] [phone]                    Discharge Exam: Ceasar Mons Weights   08/12/21 1159  Weight: 58.1 kg  BP (!) 120/49 (BP Location: Left Arm)   Pulse 76   Temp 98.1 F (36.7 C) (Oral)   Resp 20   Ht 5\' 3"  (1.6 m)   Wt 58.1 kg   SpO2 98%   BMI 22.67 kg/m  Constitutional:  VS as above General Appearance: alert, well-developed, well-nourished, NAD Eyes: Normal lids and conjunctive, non-icteric sclera Ears,  Nose, Mouth Normal appearance Neck: No masses, trachea midline Respiratory: Normal respiratory effort Breath sounds normal, no wheeze/rhonchi/rales Cardiovascular: S1/S2 normal, no murmur/rub/gallop auscultated No lower extremity edema Gastrointestinal: Nontender, no masses Musculoskeletal:  No clubbing/cyanosis of digits Neurological: No cranial nerve deficit on limited exam Psychiatric: Normal judgment/insight Normal mood and affect   Condition at discharge: good  The results of significant diagnostics from this hospitalization (including imaging, microbiology, ancillary and laboratory) are listed below for reference.   Imaging Studies: CT HEAD WO CONTRAST ( )  Result Date: 08/12/2021 CLINICAL DATA:  Neck trauma.  Status post fall. EXAM: CT HEAD WITHOUT CONTRAST CT CERVICAL SPINE WITHOUT CONTRAST TECHNIQUE: Multidetector CT imaging of the head and cervical spine was performed following the standard protocol without intravenous contrast. Multiplanar CT image reconstructions of the cervical spine were also generated. RADIATION DOSE REDUCTION: This exam was performed according to the departmental dose-optimization program which includes automated exposure control, adjustment of the mA and/or kV according to patient size and/or use of iterative reconstruction technique. COMPARISON:  None Available. FINDINGS: CT HEAD FINDINGS Brain: No evidence of acute infarction, hemorrhage, hydrocephalus, extra-axial collection or mass lesion/mass effect. There is mild diffuse low-attenuation within the subcortical and periventricular white matter  compatible with chronic microvascular disease. Vascular: No hyperdense vessel or unexpected calcification. Skull: No acute abnormality. Remote healed deformity involving the left mandibular condyle. Sinuses/Orbits: Paranasal sinuses and mastoid air cells are clear. Other: None CT CERVICAL SPINE FINDINGS Alignment: No signs of acute posttraumatic mild alignment  of the cervical spine. Chronic anterolisthesis C4 on C5 is identified measuring 2 mm, image 42/5. Skull base and vertebrae: No acute fracture. No primary bone lesion or focal pathologic process. Soft tissues and spinal canal: No prevertebral fluid or swelling. No visible canal hematoma. Disc levels: Mild multilevel disc space narrowing is identified involving C3-4, C4-5 and C5-6 Upper chest: Negative. Other: None IMPRESSION: 1. No acute intracranial abnormality. 2. 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 3. No evidence for acute cervical spine fracture or subluxation. 4. Cervical spondylosis. Electronically Signed   By: Signa Kell M.D.   On: 08/12/2021 13:33   CT Cervical Spine Wo Contrast  Result Date: 08/12/2021 CLINICAL DATA:  Neck trauma.  Status post fall. EXAM: CT HEAD WITHOUT CONTRAST CT CERVICAL SPINE WITHOUT CONTRAST TECHNIQUE: Multidetector CT imaging of the head and cervical spine was performed following the standard protocol without intravenous contrast. Multiplanar CT image reconstructions of the cervical spine were also generated. RADIATION DOSE REDUCTION: This exam was performed according to the departmental dose-optimization program which includes automated exposure control, adjustment of the mA and/or kV according to patient size and/or use of iterative reconstruction technique. COMPARISON:  None Available. FINDINGS: CT HEAD FINDINGS Brain: No evidence of acute infarction, hemorrhage, hydrocephalus, extra-axial collection or mass lesion/mass effect. There is mild diffuse low-attenuation within the subcortical and periventricular white matter compatible with chronic microvascular disease. Vascular: No hyperdense vessel or unexpected calcification. Skull: No acute abnormality. Remote healed deformity involving the left mandibular condyle. Sinuses/Orbits: Paranasal sinuses and mastoid air cells are clear. Other: None CT CERVICAL SPINE FINDINGS Alignment: No signs of acute posttraumatic  mild alignment of the cervical spine. Chronic anterolisthesis C4 on C5 is identified measuring 2 mm, image 42/5. Skull base and vertebrae: No acute fracture. No primary bone lesion or focal pathologic process. Soft tissues and spinal canal: No prevertebral fluid or swelling. No visible canal hematoma. Disc levels: Mild multilevel disc space narrowing is identified involving C3-4, C4-5 and C5-6 Upper chest: Negative. Other: None IMPRESSION: 1. No acute intracranial abnormality. 2. 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 3. No evidence for acute cervical spine fracture or subluxation. 4. Cervical spondylosis. Electronically Signed   By: Signa Kell M.D.   On: 08/12/2021 13:33   CT PELVIS WO CONTRAST  Result Date: 08/12/2021 CLINICAL DATA:  Hip trauma.  Fracture suspected. EXAM: CT PELVIS WITHOUT CONTRAST TECHNIQUE: Multidetector CT imaging of the pelvis was performed following the standard protocol without intravenous contrast. RADIATION DOSE REDUCTION: This exam was performed according to the departmental dose-optimization program which includes automated exposure control, adjustment of the mA and/or kV according to patient size and/or use of iterative reconstruction technique. COMPARISON:  Pelvis and right hip radiographs 08/12/2021; CT abdomen and pelvis 09/07/2019 FINDINGS: Urinary Tract:  No focal urinary bladder wall thickening. Bowel: Moderate to high-grade sigmoid diverticulosis. No inflammatory changes to indicate acute diverticulitis. Vascular/Lymphatic: Minimally partially visualized atherosclerotic calcifications at the aortic-iliac bifurcation. Reproductive: The uterus appears to be surgically absent. No gross adnexal abnormality is seen. Other: Moderate left-greater-than-right chronic enthesopathic changes at the bilateral common hamstring tendon origins off the ischial tuberosities. Musculoskeletal: There is a nondisplaced linear vertical oriented acute fracture within the superolateral  aspect of  the right pubic ramus (coronal series 9, image 40 and axial series 4 images 30 and 31). There is an oblique, acute fracture of the right inferior pubic ramus with mild 3 mm lateral displacement of the anterior fracture component respect of the posterior fracture component (axial series 4, image 41 and coronal series 9, image 58). There is an acute, nondisplaced vertically oriented fracture within the left pubic body (axial series 4, image 34, sagittal series 10, image 126, coronal series 9, image 31). Minimal 1 mm cortical step-off of the superior aspect of the right pubic body, likely an additional tiny fracture (coronal series 9, image 34). Small sclerotic bone island within the superomedial left acetabulum, similar to 09/07/2019 CT. Moderate to severe bilateral femoroacetabular joint space narrowing. The bilateral sacroiliac joint spaces are maintained. IMPRESSION: Minimally displaced right inferior pubic ramus and nondisplaced right superior pubic ramus, right pubic body, and left pubic body acute fractures. Electronically Signed   By: Neita Garnet M.D.   On: 08/12/2021 14:42   DG Chest Portable 1 View  Result Date: 08/12/2021 CLINICAL DATA:  Status post fall. EXAM: PORTABLE CHEST 1 VIEW COMPARISON:  None Available. FINDINGS: The lungs are clear without focal pneumonia, edema, pneumothorax or pleural effusion. Streaky opacity in the medial right base is probably related to atelectasis or scarring the cardiopericardial silhouette is within normal limits for size. Interstitial markings are diffusely coarsened with chronic features. Bones are diffusely demineralized. IMPRESSION: Hyperexpansion with chronic interstitial coarsening compatible with emphysema. Streaky opacity in the infrahilar right base likely atelectatic or scarring. Electronically Signed   By: Kennith Center M.D.   On: 08/12/2021 13:06   DG Hip Unilat  With Pelvis 2-3 Views Right  Result Date: 08/12/2021 CLINICAL DATA:  Right hip  pain following a fall today. EXAM: DG HIP (WITH OR WITHOUT PELVIS) 2-3V RIGHT COMPARISON:  None Available. FINDINGS: Normal appearing right hip without fracture or dislocation. There are essentially nondisplaced acute fractures of the right superior and inferior pubic rami. No other fractures and no dislocations. IMPRESSION: Essentially nondisplaced acute fractures of the right superior and inferior pubic rami. Electronically Signed   By: Beckie Salts M.D.   On: 08/12/2021 12:43    Microbiology: No results found for this or any previous visit.  Labs: CBC: Recent Labs  Lab 08/12/21 1401 08/13/21 0423  WBC 9.8 9.3  NEUTROABS 7.8*  --   HGB 13.1 12.7  HCT 39.4 38.0  MCV 96.1 96.0  PLT 190 180   Basic Metabolic Panel: Recent Labs  Lab 08/12/21 1401 08/13/21 0423 08/14/21 0400 08/15/21 0353  NA 141 133* 127* 130*  K 3.6 3.8 3.5 4.0  CL 105 99 94* 100  CO2 27 28 26 26   GLUCOSE 86 101* 100* 100*  BUN 17 21 22 19   CREATININE 0.74 0.92 0.77 0.82  CALCIUM 9.0 8.6* 8.1* 7.9*   Liver Function Tests: Recent Labs  Lab 08/12/21 1401  AST 25  ALT 17  ALKPHOS 48  BILITOT 1.0  PROT 6.1*  ALBUMIN 3.6   CBG: No results for input(s): GLUCAP in the last 168 hours.  Discharge time spent: greater than 30 minutes.  This record has been created using . Errors have been sought and corrected,but may not always be located. Such creation errors do not reflect on the standard of care.   Signed: 08/14/21, MD [facility] 08/15/2021

## 2021-08-14 NOTE — Plan of Care (Signed)
  Problem: Health Behavior/Discharge Planning: Goal: Ability to manage health-related needs will improve Outcome: Progressing   Problem: Clinical Measurements: Goal: Ability to maintain clinical measurements within normal limits will improve Outcome: Progressing Goal: Diagnostic test results will improve Outcome: Progressing   Problem: Activity: Goal: Risk for activity intolerance will decrease Outcome: Progressing   Problem: Pain Managment: Goal: General experience of comfort will improve Outcome: Progressing   Problem: Safety: Goal: Ability to remain free from injury will improve Outcome: Progressing   Problem: Education: Goal: Knowledge of General Education information will improve Description: Including pain rating scale, medication(s)/side effects and non-pharmacologic comfort measures Outcome: Completed/Met   Problem: Nutrition: Goal: Adequate nutrition will be maintained Outcome: Completed/Met   Problem: Skin Integrity: Goal: Risk for impaired skin integrity will decrease Outcome: Completed/Met

## 2021-08-15 DIAGNOSIS — I10 Essential (primary) hypertension: Secondary | ICD-10-CM | POA: Diagnosis not present

## 2021-08-15 DIAGNOSIS — S329XXA Fracture of unspecified parts of lumbosacral spine and pelvis, initial encounter for closed fracture: Secondary | ICD-10-CM | POA: Diagnosis not present

## 2021-08-15 LAB — BASIC METABOLIC PANEL
Anion gap: 4 — ABNORMAL LOW (ref 5–15)
BUN: 19 mg/dL (ref 8–23)
CO2: 26 mmol/L (ref 22–32)
Calcium: 7.9 mg/dL — ABNORMAL LOW (ref 8.9–10.3)
Chloride: 100 mmol/L (ref 98–111)
Creatinine, Ser: 0.82 mg/dL (ref 0.44–1.00)
GFR, Estimated: >60 mL/min (ref >60)
Glucose, Bld: 100 mg/dL — ABNORMAL HIGH (ref 70–99)
Potassium: 4 mmol/L (ref 3.5–5.1)
Sodium: 130 mmol/L — ABNORMAL LOW (ref 135–145)

## 2021-08-15 MED ORDER — ALPRAZOLAM 0.5 MG PO TABS: 0.5000 mg | ORAL_TABLET | Freq: Two times a day (BID) | ORAL | 0 refills | Status: AC | PRN

## 2021-08-15 NOTE — Progress Notes (Signed)
Report given to Gwynn Burly at [facility] facility. All question asked to satisfaction. Awaiting transport.

## 2021-08-15 NOTE — Progress Notes (Signed)
Physical Therapy Treatment Patient Details Name: Misty Marshall MRN: [id number] DOB: Oct 26, 1940 Today's Date: 08/15/2021   History of Present Illness Pt is an 81 y/o F admitted on 08/12/21 after presenting after a fall. Pt found to have BLE pubic rami fx & is WBAT BLE. PMH: Afib, HLD, HTN, GERD, depression, PAD, dizziness    PT Comments    Pt was asleep upon arrival. Easily awakes and is A and O x 4. Pt is in pain however extremely motivated and cooperative throughout. Pt requires extensive assistance to exit bed, stand, and take a few pivoting steps to recliner. Pt lives with her spouse however he will not be able to provide the level of care she will need at DC. Highly recommend DC to rehab to address deficits while maximizing independence with ADLs Pt was sitting in recliner post session with RN aware. Author will return later this date to assist pt with returning to bed. RN aware.    Recommendations for follow up therapy are one component of a multi-disciplinary discharge planning process, led by the attending physician.  Recommendations may be updated based on patient status, additional functional criteria and insurance authorization.  Follow Up Recommendations  Skilled nursing-short term rehab (<3 hours/day)     Assistance Recommended at Discharge Frequent or constant Supervision/Assistance  Patient can return home with the following A lot of help with walking and/or transfers;A lot of help with bathing/dressing/bathroom;Assistance with cooking/housework;Assist for transportation;Help with stairs or ramp for entrance   Equipment Recommendations  Rolling walker (2 wheels);BSC/3in1       Precautions / Restrictions Precautions Precautions: Fall Restrictions Weight Bearing Restrictions: Yes RLE Weight Bearing: Weight bearing as tolerated LLE Weight Bearing: Weight bearing as tolerated     Mobility  Bed Mobility Overal bed mobility: Needs Assistance Bed Mobility: Supine to Sit   Supine to sit: Max assist, HOB elevated   Transfers Overall transfer level: Needs assistance Equipment used: Rolling walker (2 wheels) Transfers: Sit to/from Stand Sit to Stand: Min assist, From elevated surface  General transfer comment: pt struggles to clear LLE due to RLE pain. Most huffled ft to pivot to recliner    Ambulation/Gait Ambulation/Gait assistance: Min assist Gait Distance (Feet): 2 Feet Assistive device: Rolling walker (2 wheels) Gait Pattern/deviations: Antalgic, Shuffle, Step-to pattern Gait velocity: decreased     General Gait Details: Pt is very pain limited but puts forth great effort. is able to clear BLE but due to pain prefers to pivot/shuffle feet on floor versus taking step.    Balance Overall balance assessment: Needs assistance Sitting-balance support: Feet supported, Bilateral upper extremity supported Sitting balance-Leahy Scale: Good Sitting balance - Comments: no LOB sitting EOB x several minutes   Standing balance support: Bilateral upper extremity supported, During functional activity, Reliant on assistive device for balance Standing balance-Leahy Scale: Fair Standing balance comment: reliant on BUE support due to pain mostly. no unsteadiness or LOB. pain limited       Cognition Arousal/Alertness: Awake/alert Behavior During Therapy: WFL for tasks assessed/performed Overall Cognitive Status: Within Functional Limits for tasks assessed        General Comments: sweet lady           General Comments General comments (skin integrity, edema, etc.): pt is extremelymotivated and pleasant. will require SNF at DC to improve independence prior to returning home      Pertinent Vitals/Pain Pain Assessment Pain Assessment: 0-10 Pain Score: 7  Pain Location: B hips (R>L) Pain Descriptors / Indicators: Grimacing, Discomfort Pain  Intervention(s): Limited activity within patient's tolerance, Monitored during session, Repositioned, Ice applied      PT Goals (current goals can now be found in the care plan section) Acute Rehab PT Goals Patient Stated Goal: decreased pain and move better Progress towards PT goals: Progressing toward goals    Frequency    7X/week      PT Plan Current plan remains appropriate    Co-evaluation     PT goals addressed during session: Mobility/safety with mobility;Balance;Proper use of DME;Strengthening/ROM        AM-PAC PT "6 Clicks" Mobility   Outcome Measure  Help needed turning from your back to your side while in a flat bed without using bedrails?: A Lot Help needed moving from lying on your back to sitting on the side of a flat bed without using bedrails?: A Lot Help needed moving to and from a bed to a chair (including a wheelchair)?: A Lot Help needed standing up from a chair using your arms (e.g., wheelchair or bedside chair)?: A Lot Help needed to walk in hospital room?: A Lot Help needed climbing 3-5 steps with a railing? : Total 6 Click Score: 11    End of Session   Activity Tolerance: Patient tolerated treatment well;Patient limited by pain Patient left: in chair;with chair alarm set;with call bell/phone within reach;with family/visitor present Nurse Communication: Mobility status PT Visit Diagnosis: Difficulty in walking, not elsewhere classified (R26.2);Unsteadiness on feet (R26.81);Pain;Muscle weakness (generalized) (M62.81) Pain - Right/Left: Right Pain - part of body: Hip     Time: 0726-0752 PT Time Calculation (min) (ACUTE ONLY): 26 min  Charges:  $Therapeutic Activity: 23-37 mins                     Jetta Lout PTA 08/15/21, 8:05 AM

## 2021-08-15 NOTE — Plan of Care (Signed)
  Problem: Health Behavior/Discharge Planning: Goal: Ability to manage health-related needs will improve Outcome: Progressing   Problem: Clinical Measurements: Goal: Ability to maintain clinical measurements within normal limits will improve Outcome: Progressing   

## 2021-08-15 NOTE — TOC Progression Note (Signed)
Transition of Care [facility]) - Progression Note    Patient Details  Name: Misty Marshall MRN: [id number] Date of Birth: Apr 04, 1940  Transition of Care [facility]) CM/SW Contact  Marlowe Sax, RN Phone Number: 08/15/2021, 11:04 AM  Clinical Narrative:    The patient is going to [facility] [place] [address] Husband Alfredo Bach is aware EMS called and there are 2 ahead of her   Expected Discharge Plan: Skilled Nursing Facility Barriers to Discharge: English as a second language teacher  Expected Discharge Plan and Services Expected Discharge Plan: Skilled Nursing Facility In-house Referral: Clinical Social Work   Post Acute Care Choice: Skilled Nursing Facility Living arrangements for the past 2 months: Single Family Home Expected Discharge Date: 08/15/21                                     Social Determinants of Health (SDOH) Interventions    Readmission Risk Interventions     View : No data to display.

## 2021-08-15 NOTE — TOC Progression Note (Signed)
Transition of Care [facility]) - Progression Note    Patient Details  Name: Misty Marshall MRN: [id number] Date of Birth: 20-Mar-1940  [facility]) CM/SW Contact  Marlowe Sax, RN Phone Number: 08/15/2021, 9:54 AM  Clinical Narrative:     Tammy with [facility] called and stated that we have Ins approval and the patient can come today  Expected Discharge Plan: Skilled Nursing Facility Barriers to Discharge: Insurance Authorization  Expected Discharge Plan and Services Expected Discharge Plan: Skilled Nursing Facility In-house Referral: Clinical Social Work   Post Acute Care Choice: Skilled Nursing Facility Living arrangements for the past 2 months: Single Family Home                                       Social Determinants of Health (SDOH) Interventions    Readmission Risk Interventions     View : No data to display.

## 2022-05-29 ENCOUNTER — Ambulatory Visit
Admission: RE | Admit: 2022-05-29 | Discharge: 2022-05-29 | Disposition: A | Discharge status: Home / Self Care | Payer: Medicare HMO | Attending: Internal Medicine | Admitting: Internal Medicine

## 2022-05-29 ENCOUNTER — Other Ambulatory Visit: Payer: Self-pay | Admitting: Internal Medicine

## 2022-05-29 ENCOUNTER — Ambulatory Visit
Admission: RE | Admit: 2022-05-29 | Discharge: 2022-05-29 | Disposition: A | Discharge status: Home / Self Care | Payer: Medicare HMO | Source: Ambulatory Visit | Attending: Internal Medicine | Admitting: Internal Medicine

## 2022-05-29 DIAGNOSIS — R052 Subacute cough: Secondary | ICD-10-CM

## 2023-01-24 ENCOUNTER — Encounter: Payer: Self-pay | Admitting: Emergency Medicine

## 2023-01-24 ENCOUNTER — Ambulatory Visit
Admission: EM | Admit: 2023-01-24 | Discharge: 2023-01-24 | Disposition: A | Discharge status: Home / Self Care | Payer: Medicare HMO | Attending: Physician Assistant | Admitting: Physician Assistant

## 2023-01-24 DIAGNOSIS — J069 Acute upper respiratory infection, unspecified: Secondary | ICD-10-CM | POA: Diagnosis present

## 2023-01-24 LAB — GROUP A STREP BY PCR: Group A Strep by PCR: NOT DETECTED

## 2023-01-24 NOTE — ED Provider Notes (Signed)
Renaldo Fiddler    CSN: [id number] Arrival date & time: 01/24/23  0859      History   Chief Complaint Chief Complaint  Patient presents with   Nasal Congestion   Cough    HPI Misty Marshall is a 82 y.o. female.   82 year old female, Misty Marshall, presents to urgent care for nasal congestion, cough, sore throat, chills x 2 days. No known illness exposure. Taking otc meds for symptom management.  Pt does not want covid or flu test.  PMH: HTN, GERD, AFIB, hyperlipidemia, arthritis  The history is provided by the patient. No language interpreter was used.    Past Medical History:  Diagnosis Date   Arthritis    Atrial dysrhythmia    Atrial fibrillation (HCC)    Depression    Dizziness    GERD (gastroesophageal reflux disease)    Hypercholesteremia    Hyperlipemia    Hypertension    Thrombophlebitis    TMJ (dislocation of temporomandibular joint)    Vasculitis (HCC)     Patient Active Problem List   Diagnosis Date Noted   Viral upper respiratory tract infection 01/24/2023   History of cardiac arrhythmia 08/13/2021   Essential hypertension 08/13/2021   Hyperlipidemia 08/13/2021   GERD (gastroesophageal reflux disease) 08/13/2021   Hyponatremia 08/13/2021   Pelvis fracture (HCC) 08/12/2021    Past Surgical History:  Procedure Laterality Date   ABDOMINAL HYSTERECTOMY     APPENDECTOMY     CHOLECYSTECTOMY     FOOT SURGERY     SMALL BOWEL REPAIR     TONSILLECTOMY     ulnar bypass Right     OB History   No obstetric history on file.      Home Medications    Prior to Admission medications   Medication Sig Start Date End Date Taking? Authorizing Provider  atorvastatin (LIPITOR) 40 MG tablet Take 40 mg by mouth daily. 08/23/17  Yes [provider]  pantoprazole (PROTONIX) 40 MG tablet Take 40 mg by mouth daily. 08/08/17  Yes [provider]  spironolactone (ALDACTONE) 25 MG tablet Take 25 mg by mouth daily. 08/23/17  Yes [provider]  ALPRAZolam Prudy Feeler) 0.5 MG tablet Take 1 tablet (0.5 mg total) by mouth 2 (two) times daily as needed for anxiety. 08/15/21   Arnetha Courser, MD  aspirin EC 81 MG tablet Take 2 tablets (162 mg total) by mouth daily. 08/14/21   Sunnie Nielsen, DO  docusate sodium (COLACE) 100 MG capsule Take 1 capsule (100 mg total) by mouth 2 (two) times daily. 08/14/21   Sunnie Nielsen, DO  HYDROcodone-acetaminophen (NORCO/VICODIN) 5-325 MG tablet Take 1-2 tablets by mouth every 6 (six) hours as needed for moderate pain or severe pain. 08/14/21   Sunnie Nielsen, DO  meloxicam (MOBIC) 15 MG tablet Take 15 mg by mouth daily as needed. 07/26/21   [provider]  promethazine (PHENERGAN) 25 MG tablet Take 25 mg by mouth as needed. 04/27/21   [provider]    Family History Family History  Problem Relation Age of Onset   Heart failure Mother    Heart failure Brother     Social History Social History   Tobacco Use   Smoking status: Never   Smokeless tobacco: Never  Vaping Use   Vaping status: Never Used  Substance Use Topics   Alcohol use: Never   Drug use: Never     Allergies   Alendronate, Celecoxib, Codeine, Risedronate, Simvastatin, Trazodone, and Sulfa antibiotics  Review of Systems Review of Systems  Constitutional:  Positive for chills. Negative for fever.  HENT:  Positive for congestion, rhinorrhea and sore throat.   Respiratory:  Positive for cough. Negative for shortness of breath, wheezing and stridor.   Cardiovascular:  Negative for chest pain and palpitations.  Gastrointestinal:  Negative for abdominal pain, diarrhea, nausea and vomiting.  Neurological:  Positive for headaches.  All other systems reviewed and are negative.    Physical Exam Triage Vital Signs ED Triage Vitals  Encounter Vitals Group     BP 01/24/23 1004 111/80     Systolic BP Percentile --      Diastolic BP Percentile --      Pulse Rate 01/24/23 1004 70     Resp  01/24/23 1004 14     Temp 01/24/23 1004 98.7 F (37.1 C)     Temp Source 01/24/23 1004 Oral     SpO2 01/24/23 1004 98 %     Weight 01/24/23 1000 128 lb 1.4 oz (58.1 kg)     Height 01/24/23 1000 5\' 3"  (1.6 m)     Head Circumference --      Peak Flow --      Pain Score 01/24/23 0959 4     Pain Loc --      Pain Education --      Exclude from Growth Chart --    No data found.  Updated Vital Signs BP 111/80 (BP Location: Left Arm)   Pulse 70   Temp 98.7 F (37.1 C) (Oral)   Resp 14   Ht 5\' 3"  (1.6 m)   Wt 128 lb 1.4 oz (58.1 kg)   SpO2 98%   BMI 22.69 kg/m   Visual Acuity Right Eye Distance:   Left Eye Distance:   Bilateral Distance:    Right Eye Near:   Left Eye Near:    Bilateral Near:     Physical Exam Vitals and nursing note reviewed.  Constitutional:      Appearance: Normal appearance. She is well-developed and well-groomed.  HENT:     Head: Normocephalic.     Right Ear: Tympanic membrane is retracted.     Left Ear: Tympanic membrane is retracted.     Nose: Mucosal edema and congestion present.     Right Sinus: No maxillary sinus tenderness or frontal sinus tenderness.     Left Sinus: No maxillary sinus tenderness or frontal sinus tenderness.  Eyes:     Pupils: Pupils are equal, round, and reactive to light.  Cardiovascular:     Rate and Rhythm: Normal rate and regular rhythm.     Heart sounds: Normal heart sounds.  Pulmonary:     Effort: Pulmonary effort is normal.     Breath sounds: Normal breath sounds and air entry.  Neurological:     General: No focal deficit present.     Mental Status: She is alert and oriented to person, place, and time.     GCS: GCS eye subscore is 4. GCS verbal subscore is 5. GCS motor subscore is 6.  Psychiatric:        Attention and Perception: Attention normal.        Mood and Affect: Mood normal.        Speech: Speech normal.        Behavior: Behavior normal. Behavior is cooperative.      UC Treatments / Results   Labs (all labs ordered are listed, but only abnormal results are displayed) Labs Reviewed  GROUP A STREP BY PCR    EKG   Radiology No results found.  Procedures Procedures (including critical care time)  Medications Ordered in UC Medications - No data to display  Initial Impression / Assessment and Plan / UC Course  I have reviewed the triage vital signs and the nursing notes.  Pertinent labs & imaging results that were available during my care of the patient were reviewed by me and considered in my medical decision making (see chart for details).  Clinical Course as of 01/24/23 1611  Fri Jan 24, 2023  1047 Strep is negative, discussed exam findings and plan of care with patient patient verbalized understanding this provider. [JD]    Clinical Course User Index [JD] Rumaysa Sabatino, Para March, NP   Discussed exam findings and plan of care with patient, strict go to ER precautions given.   Patient verbalized understanding to this provider.  Ddx: Viral illness,allergies Final Clinical Impressions(s) / UC Diagnoses   Final diagnoses:  Viral upper respiratory tract infection     Discharge Instructions      Your strep test was negative.  Most likely this is a viral illness and no antibiotics are indicated at this time . Rest, push fluids, take over-the-counter medicines for symptom management(Tylenol, ibuprofen, Chloraseptic throat lozenges, Delsym,etc).  Follow-up with PCP in 3 days if symptoms have not improved, return as needed.     ED Prescriptions   None    PDMP not reviewed this encounter.   Clancy Gourd, NP 01/24/23 [phone]

## 2023-01-24 NOTE — ED Triage Notes (Addendum)
Patient c/o runny nose, sore throat, cough, headache and chills that started on Wed. Patient denies fevers.  Patient does not want COVID or Flu test.

## 2023-01-24 NOTE — Discharge Instructions (Addendum)
Your strep test was negative.  Most likely this is a viral illness and no antibiotics are indicated at this time . Rest, push fluids, take over-the-counter medicines for symptom management(Tylenol, ibuprofen, Chloraseptic throat lozenges, Delsym,etc).  Follow-up with PCP in 3 days if symptoms have not improved, return as needed.

## 2023-11-14 IMAGING — CT CT HEAD W/O CM
4 series · 16 of 47 positions shown, 18 images · non-contrast
Comparison: None Available.

CLINICAL DATA: Neck trauma.  Status post fall.



[Series 2: head wo · axial · 0.45mm/px · z∈[-148,-28]mm · 7 of 33 slices shown, 9 images]
[im 5/33  brain]
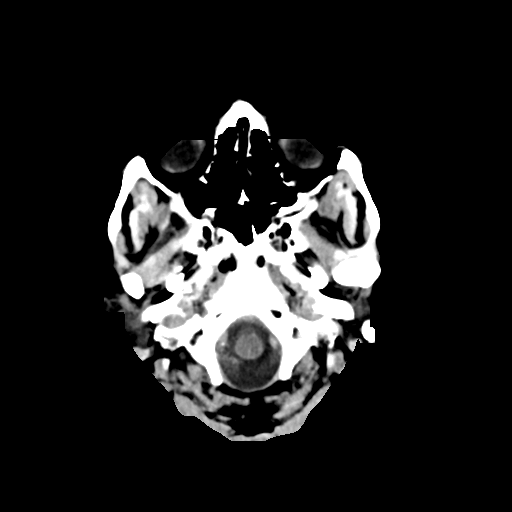
[im 5/33  bone]
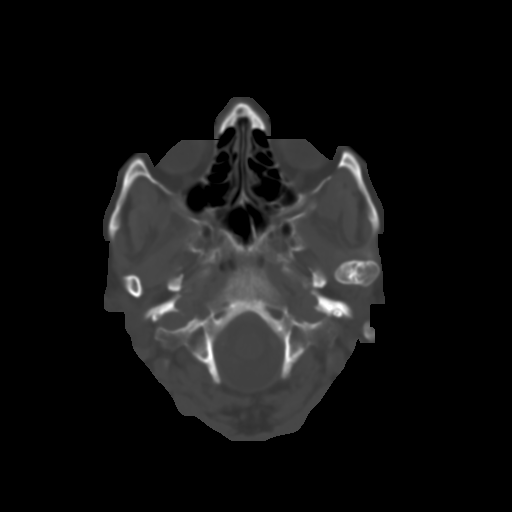
[im 9/33  brain]
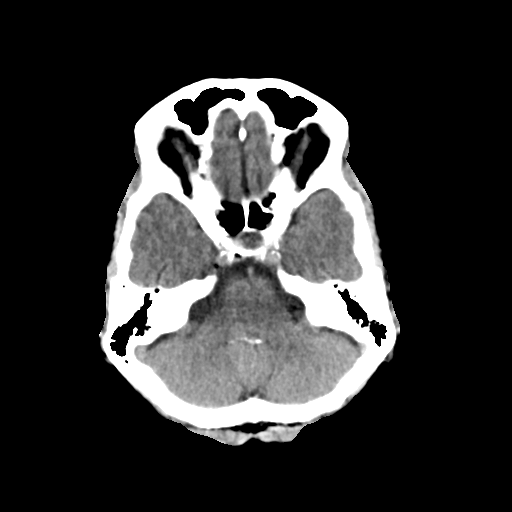
[im 13/33  brain]
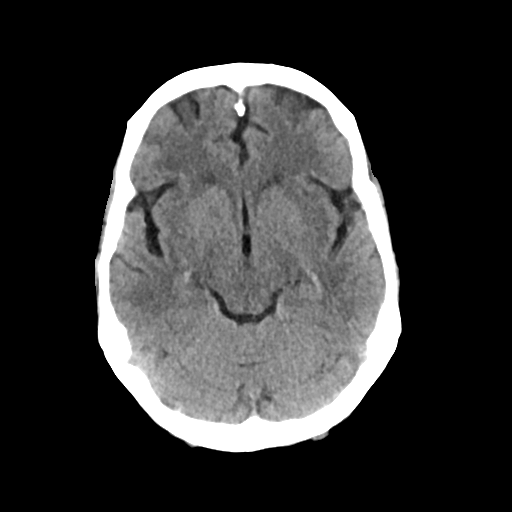
[im 17/33  brain]
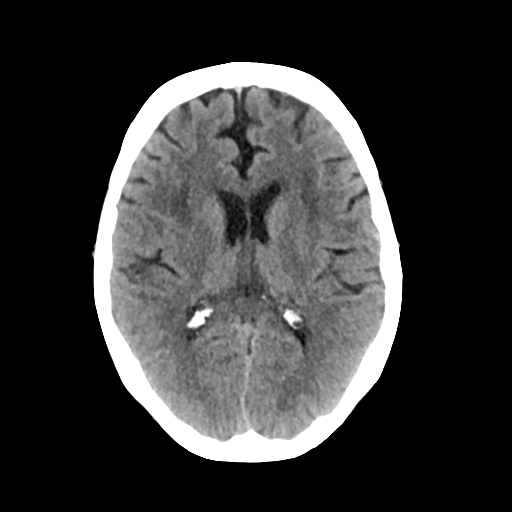
[im 21/33  brain]
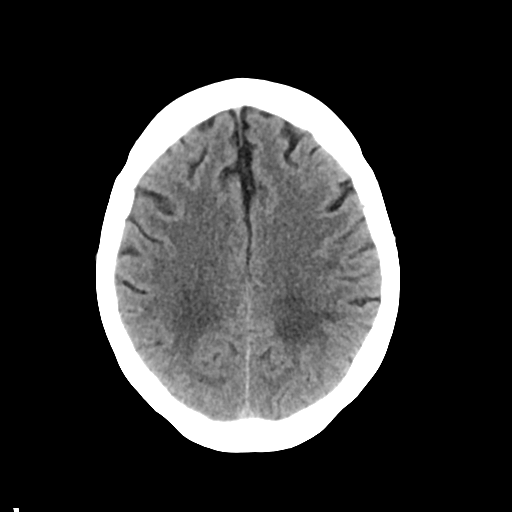
[im 21/33  bone]
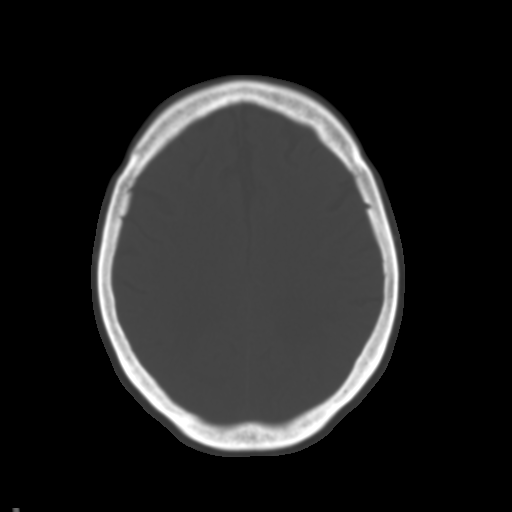
[im 25/33  brain]
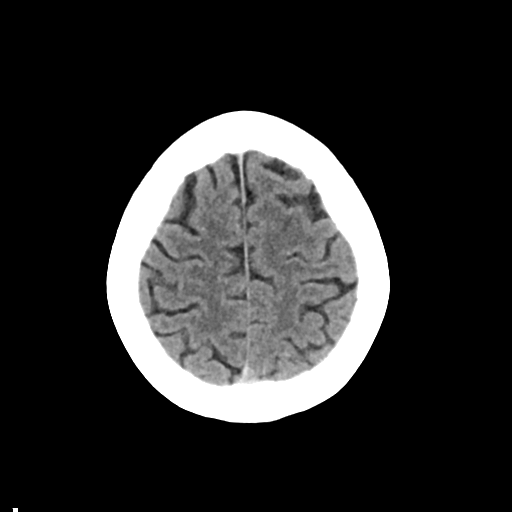
[im 29/33  brain]
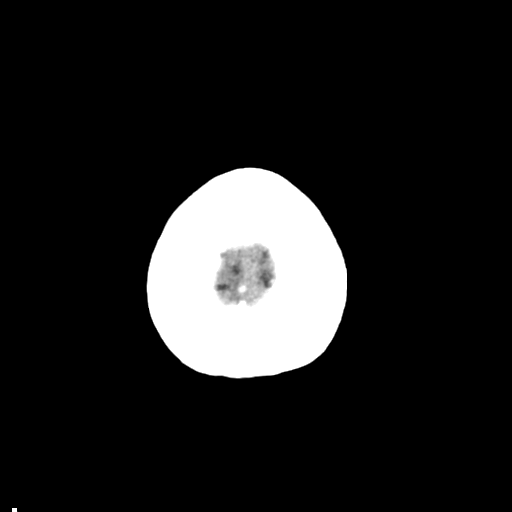

[Series 3: head bone · axial · 0.45mm/px · z∈[-152,-120]mm · 3 of 82 slices shown]
[im 9/82  bone]
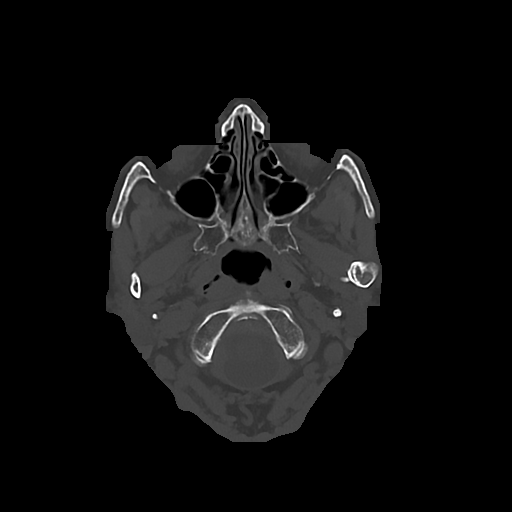
[im 17/82  bone]
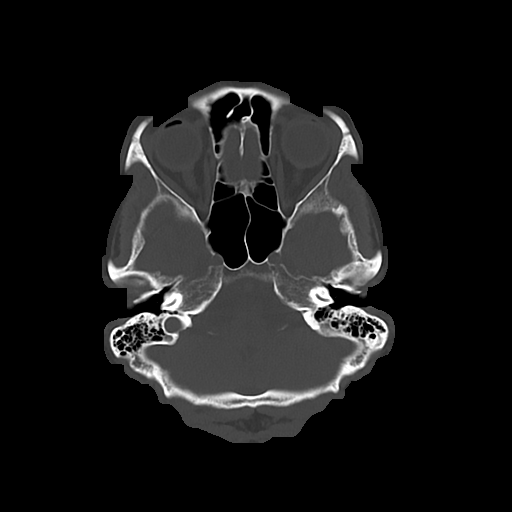
[im 25/82  bone]
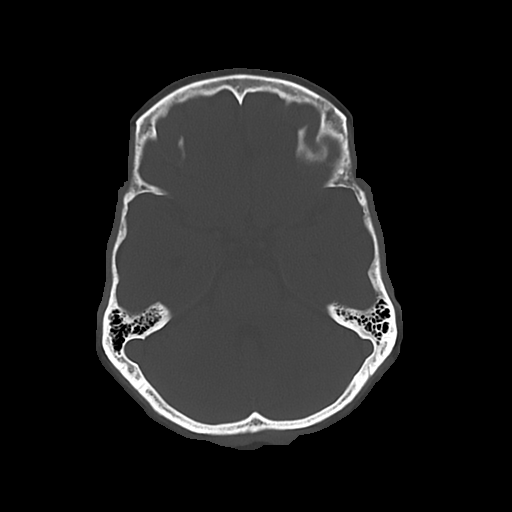

[Series 4: cor soft · coronal · 0.34mm/px · 3 of 71 slices shown]
[im 24/71  brain]
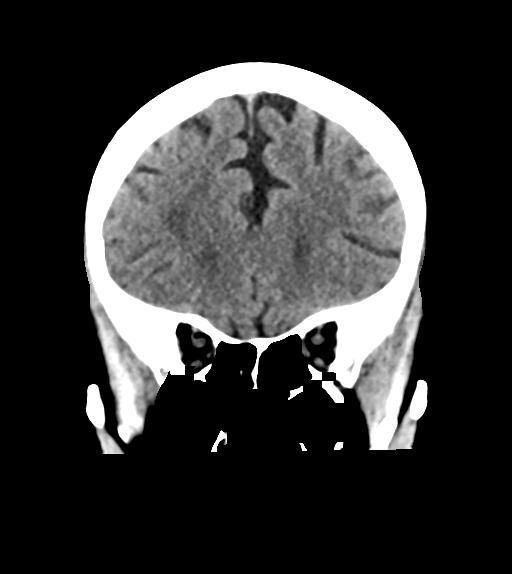
[im 32/71  brain]
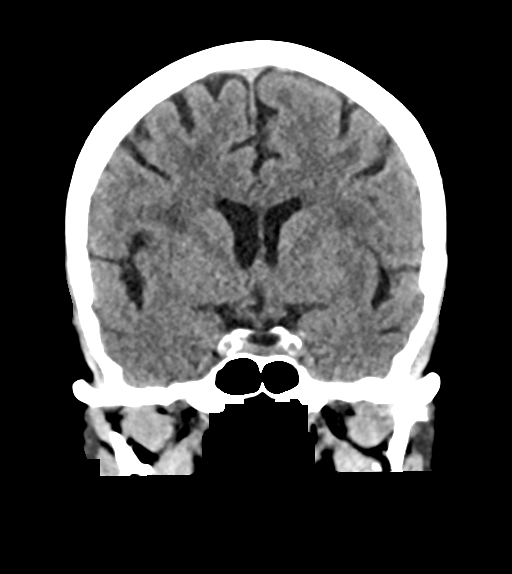
[im 39/71  brain]
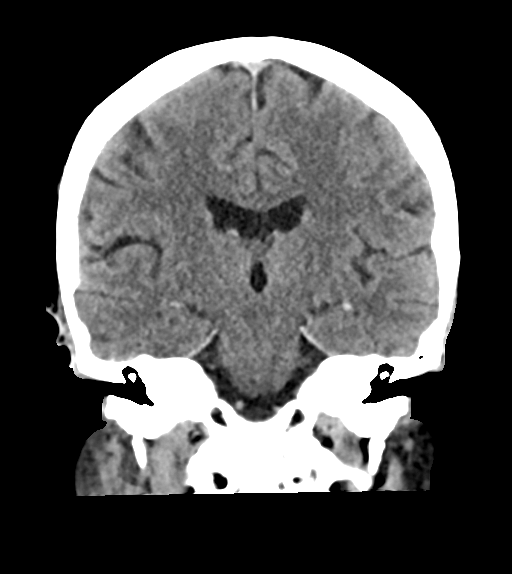

[Series 5: sag soft · sagittal · 0.38mm/px · 3 of 55 slices shown]
[im 19/55  brain]
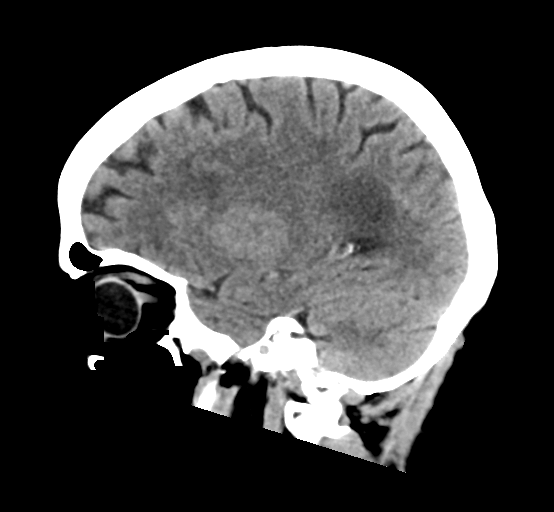
[im 28/55  brain]
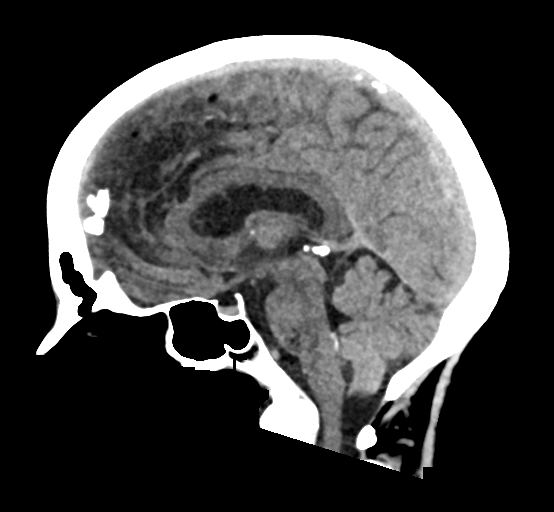
[im 37/55  brain]
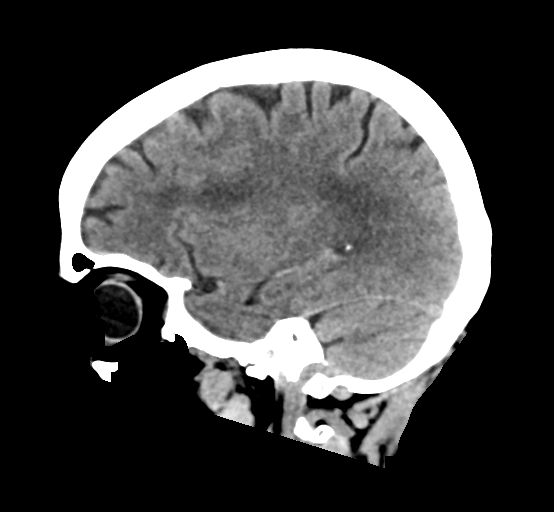

[16 of 47 positions shown; findings below may reference images not displayed]

FINDINGS: CT HEAD FINDINGS

Brain: No evidence of acute infarction, hemorrhage, hydrocephalus,
extra-axial collection or mass lesion/mass effect. There is mild
diffuse low-attenuation within the subcortical and periventricular
white matter compatible with chronic microvascular disease.

Vascular: No hyperdense vessel or unexpected calcification.

Skull: No acute abnormality. Remote healed deformity involving the
left mandibular condyle.

Sinuses/Orbits: Paranasal sinuses and mastoid air cells are clear.

Other: None

CT CERVICAL SPINE FINDINGS

Alignment: No signs of acute posttraumatic mild alignment of the
cervical spine. Chronic anterolisthesis C4 on C5 is identified
measuring 2 mm, image 42/5.

Skull base and vertebrae: No acute fracture. No primary bone lesion
or focal pathologic process.

Soft tissues and spinal canal: No prevertebral fluid or swelling. No
visible canal hematoma.

Disc levels: Mild multilevel disc space narrowing is identified
involving C3-4, C4-5 and C5-6

Upper chest: Negative.

Other: None
IMPRESSION: 1. No acute intracranial abnormality.
2. Signs of chronic small vessel ischemic disease.
3. No evidence for acute cervical spine fracture or subluxation.
4. Cervical spondylosis.

## 2023-11-14 IMAGING — CT CT CERVICAL SPINE W/O CM
2 series · 13 of 27 positions shown, 16 images · non-contrast
Comparison: None Available.

CLINICAL DATA: Neck trauma.  Status post fall.



[Series 3: c spine soft · axial · 0.39mm/px · z∈[-278,-124]mm · 8 of 91 slices shown, 10 images]
[im 7/91  soft-tissue]
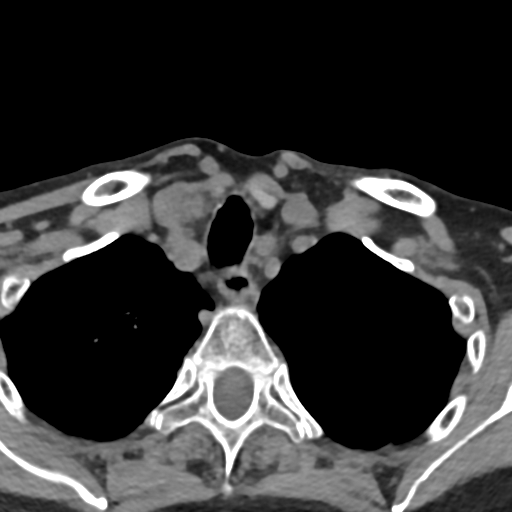
[im 7/91  bone]
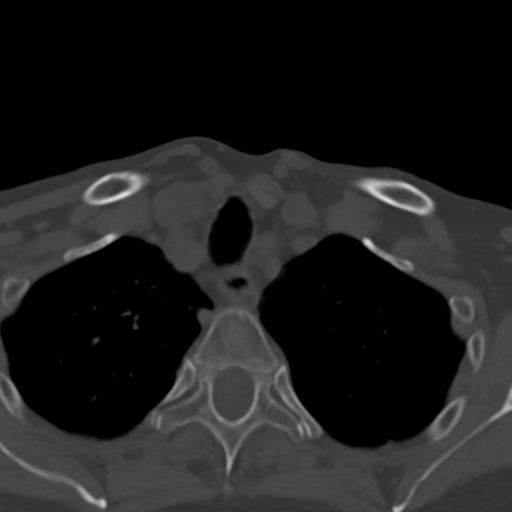
[im 21/91  bone]
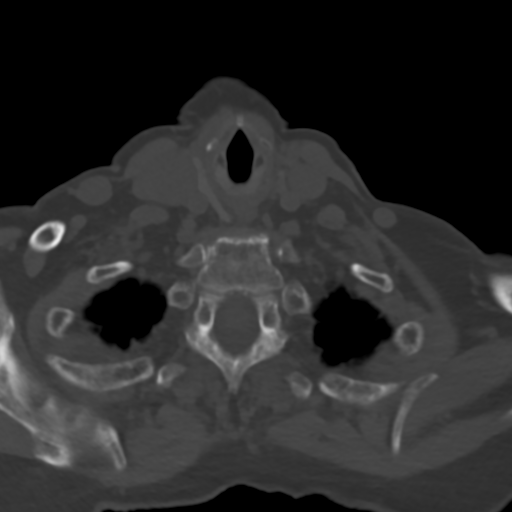
[im 28/91  bone]
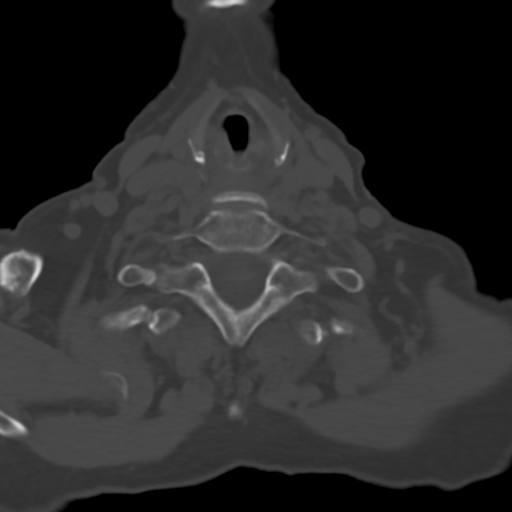
[im 42/91  bone]
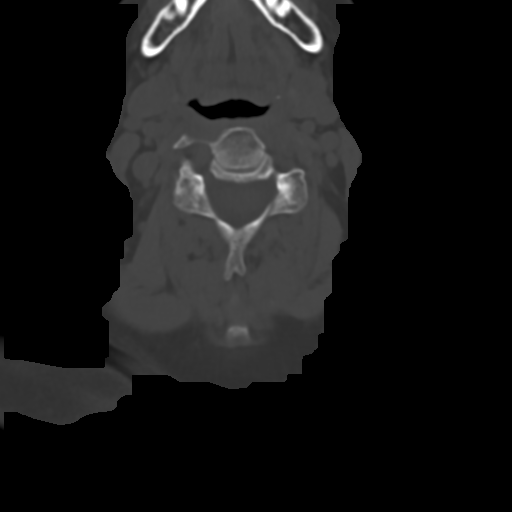
[im 49/91  soft-tissue]
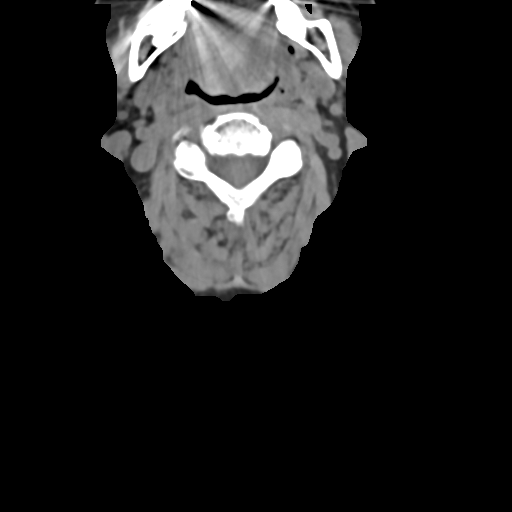
[im 49/91  bone]
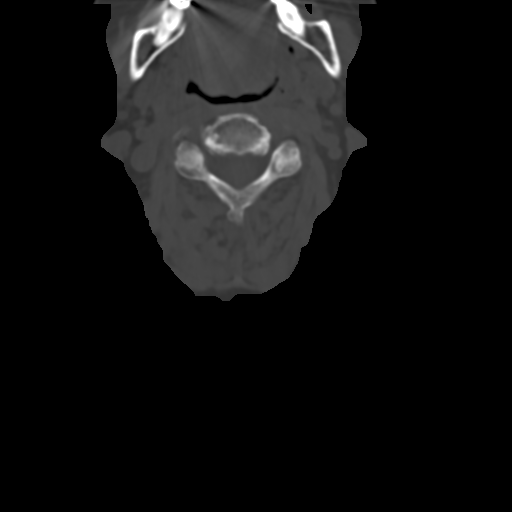
[im 63/91  bone]
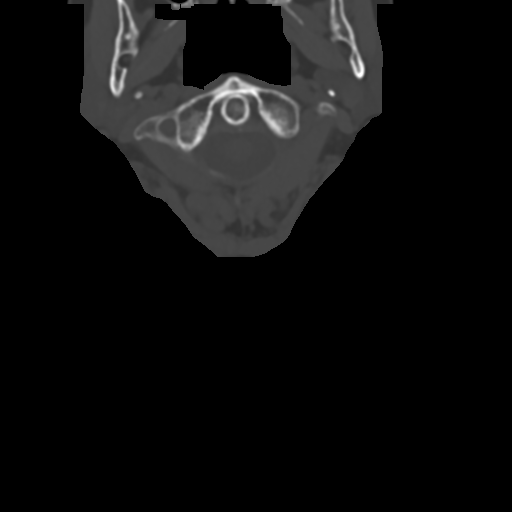
[im 70/91  bone]
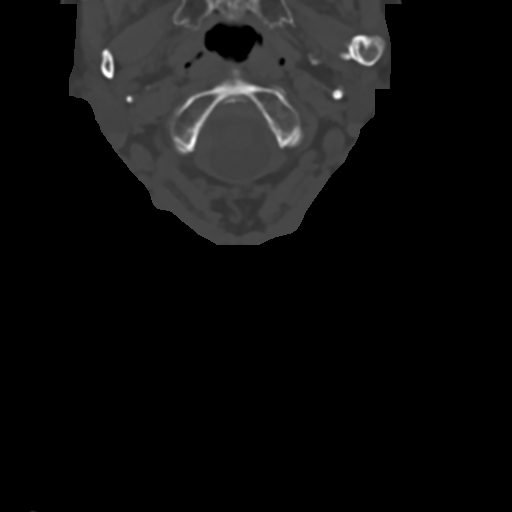
[im 84/91  bone]
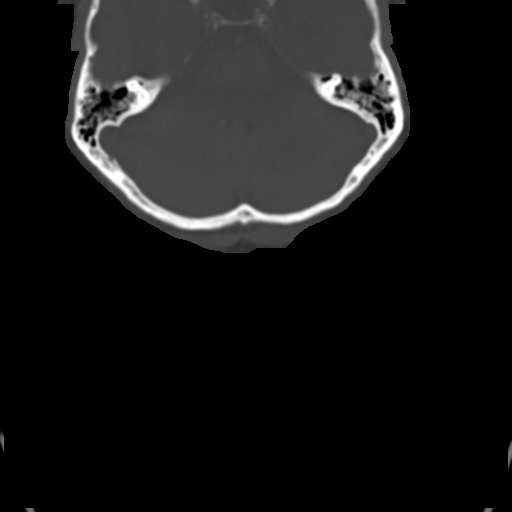

[Series 5: sag bone · sagittal · 0.30mm/px · 5 of 78 slices shown, 6 images]
[im 26/78  bone]
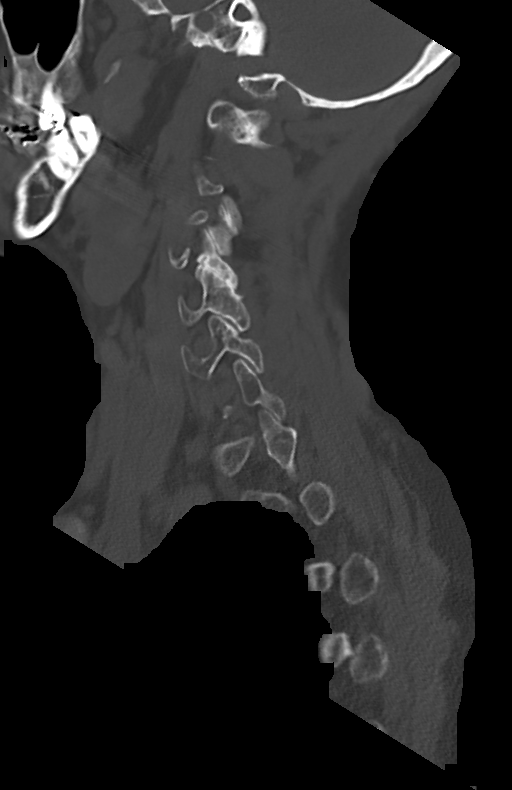
[im 33/78  bone]
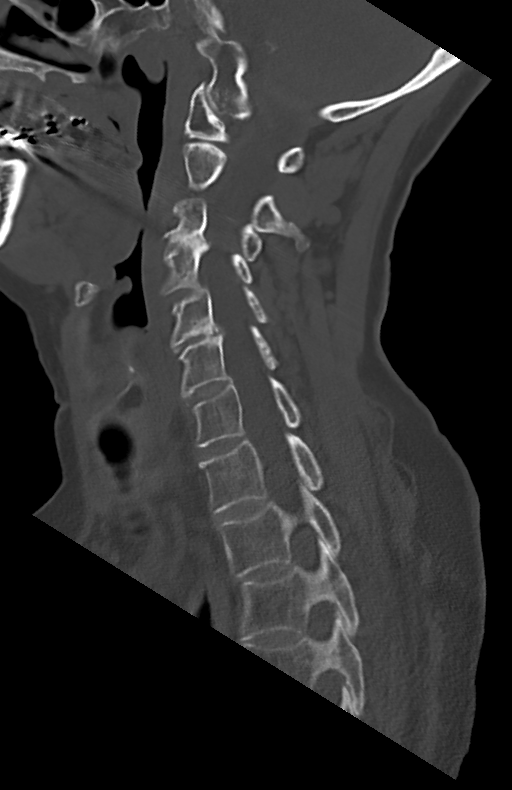
[im 39/78  soft-tissue]
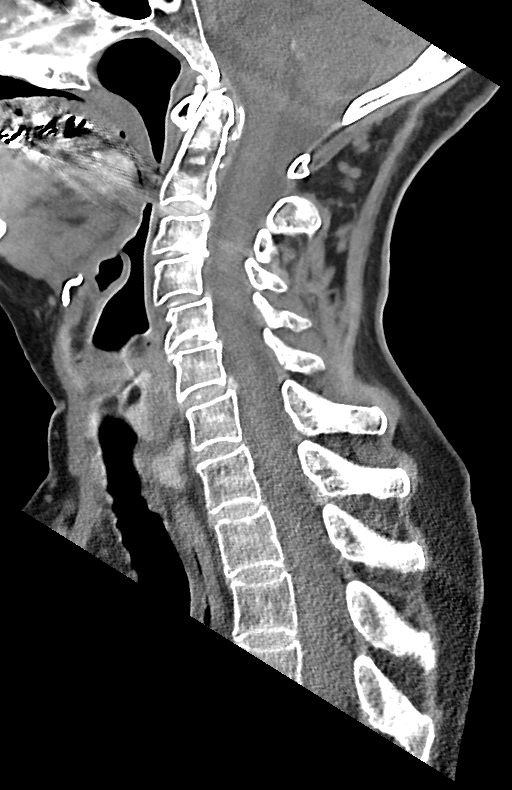
[im 39/78  bone]
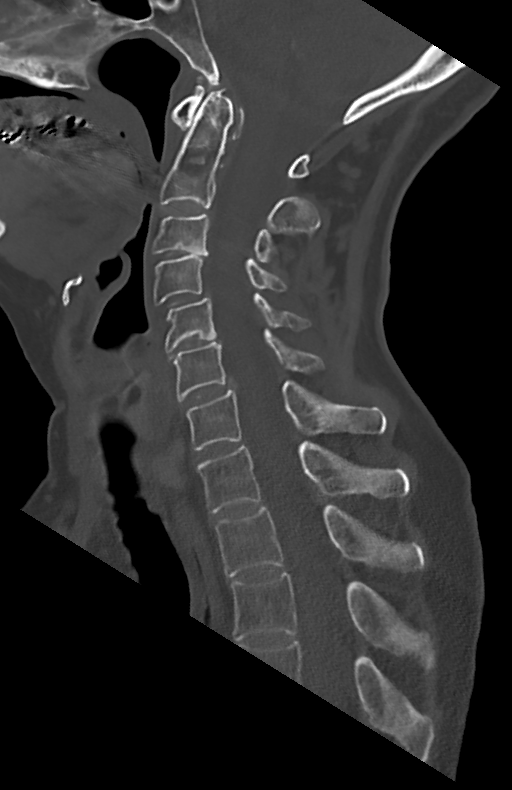
[im 45/78  bone]
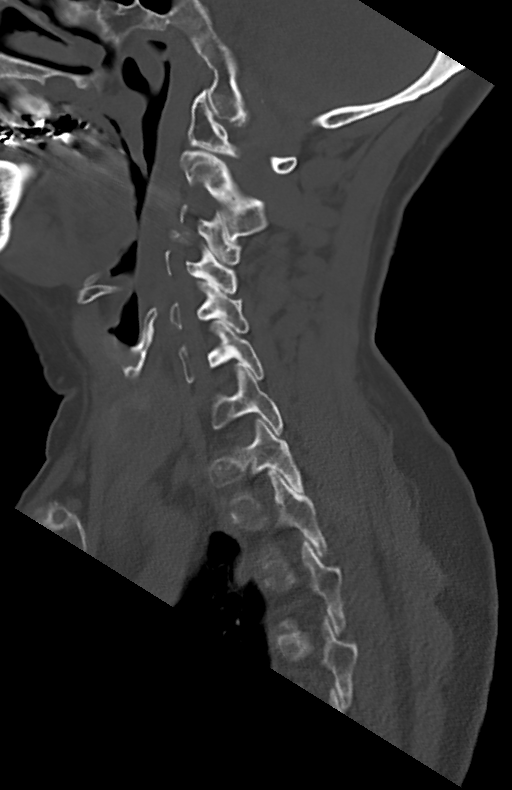
[im 52/78  bone]
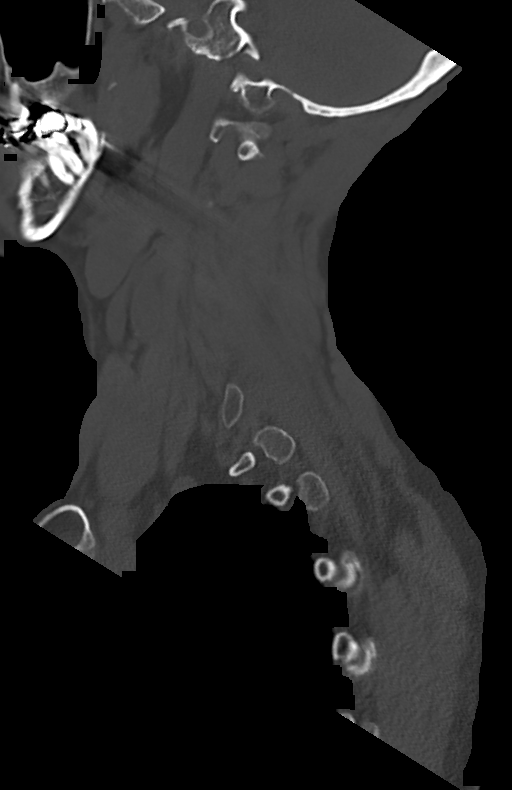

[13 of 27 positions shown; findings below may reference images not displayed]

FINDINGS: CT HEAD FINDINGS

Brain: No evidence of acute infarction, hemorrhage, hydrocephalus,
extra-axial collection or mass lesion/mass effect. There is mild
diffuse low-attenuation within the subcortical and periventricular
white matter compatible with chronic microvascular disease.

Vascular: No hyperdense vessel or unexpected calcification.

Skull: No acute abnormality. Remote healed deformity involving the
left mandibular condyle.

Sinuses/Orbits: Paranasal sinuses and mastoid air cells are clear.

Other: None

CT CERVICAL SPINE FINDINGS

Alignment: No signs of acute posttraumatic mild alignment of the
cervical spine. Chronic anterolisthesis C4 on C5 is identified
measuring 2 mm, image 42/5.

Skull base and vertebrae: No acute fracture. No primary bone lesion
or focal pathologic process.

Soft tissues and spinal canal: No prevertebral fluid or swelling. No
visible canal hematoma.

Disc levels: Mild multilevel disc space narrowing is identified
involving C3-4, C4-5 and C5-6

Upper chest: Negative.

Other: None
IMPRESSION: 1. No acute intracranial abnormality.
2. Signs of chronic small vessel ischemic disease.
3. No evidence for acute cervical spine fracture or subluxation.
4. Cervical spondylosis.
